# Patient Record
Sex: Male | Born: 1965 | Race: White | Hispanic: No | Marital: Married | Smoking: Never smoker
Health system: Southern US, Community
[De-identification: ages and names within clinical notes are randomized; demographics above are authoritative.]

## PROBLEM LIST (undated history)

## (undated) DIAGNOSIS — R51 Headache: Secondary | ICD-10-CM

## (undated) DIAGNOSIS — R519 Headache, unspecified: Secondary | ICD-10-CM

## (undated) DIAGNOSIS — Z86711 Personal history of pulmonary embolism: Secondary | ICD-10-CM

## (undated) HISTORY — PX: TONSILLECTOMY: SUR1361

## (undated) HISTORY — DX: Personal history of pulmonary embolism: Z86.711

---

## 2007-08-24 ENCOUNTER — Emergency Department (HOSPITAL_COMMUNITY): Admission: EM | Admit: 2007-08-24 | Discharge: 2007-08-24 | Payer: Self-pay | Admitting: Emergency Medicine

## 2015-09-15 NOTE — Pre-Procedure Instructions (Signed)
ERCEL PEPITONE  09/15/2015      Your procedure is scheduled on September 18.  Report to [facility] Admitting at 7:30 A.M.  Call this number if you have problems the morning of surgery:  [phone]   Remember:  Do not eat food or drink liquids after midnight.  Take these medicines the morning of surgery with A SIP OF WATER None   STOP Ibuprofen today   STOP/ Do not take Aspirin, Aleve, Naproxen, Advil, Ibuprofen, Motrin, Vitamins, Herbs, or Supplements starting today   Do not wear jewelry, make-up or nail polish.  Do not wear lotions, powders, or perfumes, or deoderant.  Do not shave 48 hours prior to surgery.  Men may shave face and neck.  Do not bring valuables to the hospital.  [facility] is not responsible for any belongings or valuables.  Contacts, dentures or bridgework may not be worn into surgery.  Leave your suitcase in the car.  After surgery it may be brought to your room.  For patients admitted to the hospital, discharge time will be determined by your treatment team.  Patients discharged the day of surgery will not be allowed to drive home.   [facility] - Preparing for Surgery  Before surgery, you can play an important role.  Because skin is not sterile, your skin needs to be as free of germs as possible.  You can reduce the number of germs on you skin by washing with CHG (chlorahexidine gluconate) soap before surgery.  CHG is an antiseptic cleaner which kills germs and bonds with the skin to continue killing germs even after washing.  Please DO NOT use if you have an allergy to CHG or antibacterial soaps.  If your skin becomes reddened/irritated stop using the CHG and inform your nurse when you arrive at Short Stay.  Do not shave (including legs and underarms) for at least 48 hours prior to the first CHG shower.  You may shave your face.  Please follow these instructions carefully:   1.  Shower with CHG Soap the night before surgery and the  morning of Surgery.  2.  If you choose to wash your hair, wash your hair first as usual with your normal shampoo.  3.  After you shampoo, rinse your hair and body thoroughly to remove the shampoo.  4.  Use CHG as you would any other liquid soap.  You can apply CHG directly to the skin and wash gently with scrungie or a clean washcloth.  5.  Apply the CHG Soap to your body ONLY FROM THE NECK DOWN.  Do not use on open wounds or open sores.  Avoid contact with your eyes, ears, mouth and genitals (private parts).  Wash genitals (private parts) with your normal soap.  6.  Wash thoroughly, paying special attention to the area where your surgery will be performed.  7.  Thoroughly rinse your body with warm water from the neck down.  8.  DO NOT shower/wash with your normal soap after using and rinsing off the CHG Soap.  9.  Pat yourself dry with a clean towel.            10.  Wear clean pajamas.            11.  Place clean sheets on your bed the night of your first shower and do not sleep with pets.  Day of Surgery  Do not apply any lotions the morning of surgery.  Please  wear clean clothes to the hospital/surgery center.

## 2015-09-16 ENCOUNTER — Other Ambulatory Visit (HOSPITAL_COMMUNITY): Payer: Self-pay | Admitting: *Deleted

## 2015-09-16 ENCOUNTER — Encounter (HOSPITAL_COMMUNITY): Payer: Self-pay

## 2015-09-16 ENCOUNTER — Encounter (HOSPITAL_COMMUNITY)
Admission: RE | Admit: 2015-09-16 | Discharge: 2015-09-16 | Disposition: A | Discharge status: Home / Self Care | Payer: 59 | Source: Ambulatory Visit | Attending: General Surgery | Admitting: General Surgery

## 2015-09-16 DIAGNOSIS — Z01812 Encounter for preprocedural laboratory examination: Secondary | ICD-10-CM | POA: Insufficient documentation

## 2015-09-16 HISTORY — DX: Headache: R51

## 2015-09-16 HISTORY — DX: Headache, unspecified: R51.9

## 2015-09-16 LAB — CBC
HEMATOCRIT: 47.3 % (ref 39.0–52.0)
HEMOGLOBIN: 15.8 g/dL (ref 13.0–17.0)
MCH: 30.7 pg (ref 26.0–34.0)
MCHC: 33.4 g/dL (ref 30.0–36.0)
MCV: 91.8 fL (ref 78.0–100.0)
Platelets: 320 10*3/uL (ref 150–400)
RBC: 5.15 MIL/uL (ref 4.22–5.81)
RDW: 12.4 % (ref 11.5–15.5)
WBC: 6.8 10*3/uL (ref 4.0–10.5)

## 2015-09-16 NOTE — Progress Notes (Signed)
No Orders @ pre-admission,office has been called twice.

## 2015-09-22 ENCOUNTER — Ambulatory Visit (HOSPITAL_BASED_OUTPATIENT_CLINIC_OR_DEPARTMENT_OTHER)
Admission: RE | Admit: 2015-09-22 | Discharge: 2015-09-23 | Disposition: A | Discharge status: Home / Self Care | Payer: 59 | Source: Ambulatory Visit | Attending: General Surgery | Admitting: General Surgery

## 2015-09-22 ENCOUNTER — Encounter (HOSPITAL_COMMUNITY)
Admission: RE | Disposition: A | Discharge status: Home / Self Care | Payer: Self-pay | Source: Ambulatory Visit | Attending: General Surgery

## 2015-09-22 ENCOUNTER — Ambulatory Visit (HOSPITAL_COMMUNITY): Payer: 59 | Admitting: Anesthesiology

## 2015-09-22 ENCOUNTER — Encounter (HOSPITAL_COMMUNITY): Payer: Self-pay | Admitting: Anesthesiology

## 2015-09-22 DIAGNOSIS — K4020 Bilateral inguinal hernia, without obstruction or gangrene, not specified as recurrent: Secondary | ICD-10-CM | POA: Diagnosis present

## 2015-09-22 DIAGNOSIS — I248 Other forms of acute ischemic heart disease: Secondary | ICD-10-CM | POA: Diagnosis present

## 2015-09-22 DIAGNOSIS — R55 Syncope and collapse: Secondary | ICD-10-CM | POA: Diagnosis present

## 2015-09-22 DIAGNOSIS — I2602 Saddle embolus of pulmonary artery with acute cor pulmonale: Secondary | ICD-10-CM | POA: Diagnosis not present

## 2015-09-22 DIAGNOSIS — I82431 Acute embolism and thrombosis of right popliteal vein: Secondary | ICD-10-CM | POA: Diagnosis present

## 2015-09-22 DIAGNOSIS — I447 Left bundle-branch block, unspecified: Secondary | ICD-10-CM | POA: Diagnosis present

## 2015-09-22 DIAGNOSIS — I959 Hypotension, unspecified: Secondary | ICD-10-CM | POA: Insufficient documentation

## 2015-09-22 DIAGNOSIS — I2510 Atherosclerotic heart disease of native coronary artery without angina pectoris: Secondary | ICD-10-CM | POA: Diagnosis present

## 2015-09-22 DIAGNOSIS — I951 Orthostatic hypotension: Secondary | ICD-10-CM | POA: Diagnosis present

## 2015-09-22 DIAGNOSIS — I272 Other secondary pulmonary hypertension: Secondary | ICD-10-CM | POA: Diagnosis present

## 2015-09-22 DIAGNOSIS — R0902 Hypoxemia: Secondary | ICD-10-CM | POA: Diagnosis not present

## 2015-09-22 DIAGNOSIS — I420 Dilated cardiomyopathy: Secondary | ICD-10-CM | POA: Diagnosis present

## 2015-09-22 DIAGNOSIS — I5041 Acute combined systolic (congestive) and diastolic (congestive) heart failure: Secondary | ICD-10-CM | POA: Diagnosis present

## 2015-09-22 HISTORY — PX: INGUINAL HERNIA REPAIR: SHX194

## 2015-09-22 HISTORY — PX: INSERTION OF MESH: SHX5868

## 2015-09-22 LAB — CBC
HCT: 42.7 % (ref 39.0–52.0)
Hemoglobin: 14.3 g/dL (ref 13.0–17.0)
MCH: 31.1 pg (ref 26.0–34.0)
MCHC: 33.5 g/dL (ref 30.0–36.0)
MCV: 92.8 fL (ref 78.0–100.0)
PLATELETS: 274 10*3/uL (ref 150–400)
RBC: 4.6 MIL/uL (ref 4.22–5.81)
RDW: 12.4 % (ref 11.5–15.5)
WBC: 17.2 10*3/uL — ABNORMAL HIGH (ref 4.0–10.5)

## 2015-09-22 SURGERY — REPAIR, HERNIA, INGUINAL, BILATERAL, LAPAROSCOPIC
Anesthesia: General | Laterality: Bilateral

## 2015-09-22 MED ORDER — PROPOFOL 10 MG/ML IV BOLUS
INTRAVENOUS | Status: DC | PRN
  Administered 2015-09-22: 50 mg via INTRAVENOUS

## 2015-09-22 MED ORDER — LIDOCAINE HCL (CARDIAC) 20 MG/ML IV SOLN
INTRAVENOUS | Status: DC | PRN
  Administered 2015-09-22: 60 mg via INTRAVENOUS

## 2015-09-22 MED ORDER — ONDANSETRON HCL 4 MG/2ML IJ SOLN
4.0000 mg | INTRAMUSCULAR | Status: DC | PRN
Start: 2015-09-22 — End: 2015-09-23
  Administered 2015-09-22 – 2015-09-23 (×2): 4 mg via INTRAVENOUS
  Filled 2015-09-22: qty 2

## 2015-09-22 MED ORDER — ONDANSETRON HCL 4 MG/2ML IJ SOLN
INTRAMUSCULAR | Status: AC
  Filled 2015-09-22: qty 2

## 2015-09-22 MED ORDER — ROCURONIUM BROMIDE 100 MG/10ML IV SOLN
INTRAVENOUS | Status: DC | PRN
  Administered 2015-09-22 (×2): 20 mg via INTRAVENOUS
  Administered 2015-09-22: 40 mg via INTRAVENOUS

## 2015-09-22 MED ORDER — OXYCODONE HCL 5 MG PO TABS
ORAL_TABLET | ORAL | Status: AC
Start: 2015-09-22 — End: 2015-09-23
  Filled 2015-09-22: qty 1

## 2015-09-22 MED ORDER — FENTANYL CITRATE (PF) 100 MCG/2ML IJ SOLN
INTRAMUSCULAR | Status: DC | PRN
  Administered 2015-09-22 (×5): 100 ug via INTRAVENOUS

## 2015-09-22 MED ORDER — CEFAZOLIN SODIUM-DEXTROSE 2-4 GM/100ML-% IV SOLN
INTRAVENOUS | Status: AC
  Filled 2015-09-22: qty 100

## 2015-09-22 MED ORDER — FENTANYL CITRATE (PF) 100 MCG/2ML IJ SOLN
INTRAMUSCULAR | Status: AC
  Filled 2015-09-22: qty 2

## 2015-09-22 MED ORDER — FENTANYL CITRATE (PF) 100 MCG/2ML IJ SOLN
INTRAMUSCULAR | Status: AC
  Filled 2015-09-22: qty 4

## 2015-09-22 MED ORDER — ONDANSETRON 4 MG PO TBDP: 4.0000 mg | ORAL_TABLET | Freq: Four times a day (QID) | ORAL | Status: DC | PRN

## 2015-09-22 MED ORDER — MIDAZOLAM HCL 5 MG/5ML IJ SOLN
INTRAMUSCULAR | Status: DC | PRN
  Administered 2015-09-22: 2 mg via INTRAVENOUS

## 2015-09-22 MED ORDER — FENTANYL CITRATE (PF) 100 MCG/2ML IJ SOLN
25.0000 ug | INTRAMUSCULAR | Status: DC | PRN
  Administered 2015-09-22 (×2): 50 ug via INTRAVENOUS

## 2015-09-22 MED ORDER — OXYCODONE HCL 5 MG PO TABS: 5.0000 mg | ORAL_TABLET | ORAL | 0 refills | Status: DC | PRN

## 2015-09-22 MED ORDER — 0.9 % SODIUM CHLORIDE (POUR BTL) OPTIME
TOPICAL | Status: DC | PRN
  Administered 2015-09-22: 1000 mL

## 2015-09-22 MED ORDER — KCL IN DEXTROSE-NACL 20-5-0.9 MEQ/L-%-% IV SOLN
INTRAVENOUS | Status: DC
  Administered 2015-09-22: 23:00:00 via INTRAVENOUS
  Filled 2015-09-22: qty 1000

## 2015-09-22 MED ORDER — CEFAZOLIN SODIUM-DEXTROSE 2-3 GM-% IV SOLR
INTRAVENOUS | Status: DC | PRN
  Administered 2015-09-22: 2 g via INTRAVENOUS

## 2015-09-22 MED ORDER — OXYCODONE HCL 5 MG PO TABS
5.0000 mg | ORAL_TABLET | ORAL | Status: DC | PRN
  Administered 2015-09-22 (×3): 5 mg via ORAL
  Filled 2015-09-22: qty 2

## 2015-09-22 MED ORDER — PROPOFOL 10 MG/ML IV BOLUS
INTRAVENOUS | Status: AC
  Filled 2015-09-22: qty 20

## 2015-09-22 MED ORDER — DOCUSATE SODIUM 100 MG PO CAPS
100.0000 mg | ORAL_CAPSULE | Freq: Two times a day (BID) | ORAL | Status: DC
  Administered 2015-09-22 – 2015-09-23 (×2): 100 mg via ORAL
  Filled 2015-09-22 (×2): qty 1

## 2015-09-22 MED ORDER — SUGAMMADEX SODIUM 200 MG/2ML IV SOLN
INTRAVENOUS | Status: DC | PRN
  Administered 2015-09-22: 200 mg via INTRAVENOUS

## 2015-09-22 MED ORDER — MIDAZOLAM HCL 2 MG/2ML IJ SOLN
INTRAMUSCULAR | Status: AC
  Filled 2015-09-22: qty 2

## 2015-09-22 MED ORDER — MORPHINE SULFATE (PF) 2 MG/ML IV SOLN: 2.0000 mg | INTRAVENOUS | Status: DC | PRN

## 2015-09-22 MED ORDER — LIDOCAINE 2% (20 MG/ML) 5 ML SYRINGE
INTRAMUSCULAR | Status: AC
  Filled 2015-09-22: qty 5

## 2015-09-22 MED ORDER — ROCURONIUM BROMIDE 10 MG/ML (PF) SYRINGE
PREFILLED_SYRINGE | INTRAVENOUS | Status: AC
  Filled 2015-09-22: qty 10

## 2015-09-22 MED ORDER — PROMETHAZINE HCL 25 MG/ML IJ SOLN: 6.2500 mg | INTRAMUSCULAR | Status: DC | PRN

## 2015-09-22 MED ORDER — BUPIVACAINE-EPINEPHRINE 0.5% -1:200000 IJ SOLN
INTRAMUSCULAR | Status: DC | PRN
  Administered 2015-09-22: 8 mL

## 2015-09-22 MED ORDER — IBUPROFEN 400 MG PO TABS: 400.0000 mg | ORAL_TABLET | Freq: Four times a day (QID) | ORAL | Status: DC | PRN

## 2015-09-22 MED ORDER — OXYCODONE HCL 5 MG PO TABS
ORAL_TABLET | ORAL | Status: AC
  Filled 2015-09-22: qty 1

## 2015-09-22 MED ORDER — OXYCODONE HCL 5 MG PO TABS
5.0000 mg | ORAL_TABLET | ORAL | Status: DC | PRN
  Administered 2015-09-23 (×2): 5 mg via ORAL
  Filled 2015-09-22 (×3): qty 1

## 2015-09-22 MED ORDER — LACTATED RINGERS IV SOLN
INTRAVENOUS | Status: DC
  Administered 2015-09-22 (×3): via INTRAVENOUS

## 2015-09-22 MED ORDER — ONDANSETRON HCL 4 MG/2ML IJ SOLN
INTRAMUSCULAR | Status: DC | PRN
  Administered 2015-09-22: 4 mg via INTRAVENOUS

## 2015-09-22 MED ORDER — SUGAMMADEX SODIUM 200 MG/2ML IV SOLN
INTRAVENOUS | Status: AC
  Filled 2015-09-22: qty 4

## 2015-09-22 MED ORDER — BUPIVACAINE-EPINEPHRINE (PF) 0.5% -1:200000 IJ SOLN
INTRAMUSCULAR | Status: AC
  Filled 2015-09-22: qty 30

## 2015-09-22 SURGICAL SUPPLY — 41 items
APPLIER CLIP LOGIC TI 5 (MISCELLANEOUS) IMPLANT
BENZOIN TINCTURE PRP APPL 2/3 (GAUZE/BANDAGES/DRESSINGS) ×3 IMPLANT
CANISTER SUCTION 2500CC (MISCELLANEOUS) ×3 IMPLANT
CHLORAPREP W/TINT 26ML (MISCELLANEOUS) ×3 IMPLANT
CLOSURE WOUND 1/2 X4 (GAUZE/BANDAGES/DRESSINGS) ×1
COVER SURGICAL LIGHT HANDLE (MISCELLANEOUS) ×3 IMPLANT
DECANTER SPIKE VIAL GLASS SM (MISCELLANEOUS) ×3 IMPLANT
DISSECT BALLN SPACEMKR + OVL (BALLOONS) ×3
DISSECTOR BALLN SPACEMKR + OVL (BALLOONS) ×1 IMPLANT
DISSECTOR BLUNT TIP ENDO 5MM (MISCELLANEOUS) ×3 IMPLANT
DRAPE LAPAROSCOPIC ABDOMINAL (DRAPES) ×3 IMPLANT
DRSG TEGADERM 2-3/8X2-3/4 SM (GAUZE/BANDAGES/DRESSINGS) ×6 IMPLANT
DRSG TEGADERM 4X4.75 (GAUZE/BANDAGES/DRESSINGS) ×3 IMPLANT
ELECT REM PT RETURN 9FT ADLT (ELECTROSURGICAL) ×3
ELECTRODE REM PT RTRN 9FT ADLT (ELECTROSURGICAL) ×1 IMPLANT
GAUZE SPONGE 2X2 8PLY STRL LF (GAUZE/BANDAGES/DRESSINGS) ×1 IMPLANT
GLOVE BIOGEL PI IND STRL 7.0 (GLOVE) ×1 IMPLANT
GLOVE BIOGEL PI IND STRL 8 (GLOVE) ×1 IMPLANT
GLOVE BIOGEL PI INDICATOR 7.0 (GLOVE) ×2
GLOVE BIOGEL PI INDICATOR 8 (GLOVE) ×2
GLOVE ECLIPSE 8.0 STRL XLNG CF (GLOVE) ×3 IMPLANT
GLOVE SURG SS PI 6.5 STRL IVOR (GLOVE) ×3 IMPLANT
GOWN STRL REUS W/ TWL LRG LVL3 (GOWN DISPOSABLE) ×4 IMPLANT
GOWN STRL REUS W/TWL LRG LVL3 (GOWN DISPOSABLE) ×8
KIT BASIN OR (CUSTOM PROCEDURE TRAY) ×3 IMPLANT
KIT ROOM TURNOVER OR (KITS) ×3 IMPLANT
MESH HERNIA 6X6 BARD (Mesh General) ×2 IMPLANT
MESH HERNIA BARD 6X6 (Mesh General) ×4 IMPLANT
NEEDLE INSUFFLATION 14GA 120MM (NEEDLE) ×3 IMPLANT
NS IRRIG 1000ML POUR BTL (IV SOLUTION) ×3 IMPLANT
PAD ARMBOARD 7.5X6 YLW CONV (MISCELLANEOUS) ×6 IMPLANT
SET IRRIG TUBING LAPAROSCOPIC (IRRIGATION / IRRIGATOR) IMPLANT
SET TROCAR LAP APPLE-HUNT 5MM (ENDOMECHANICALS) ×3 IMPLANT
SPONGE GAUZE 2X2 STER 10/PKG (GAUZE/BANDAGES/DRESSINGS) ×2
STRIP CLOSURE SKIN 1/2X4 (GAUZE/BANDAGES/DRESSINGS) ×2 IMPLANT
SUT MNCRL AB 4-0 PS2 18 (SUTURE) ×6 IMPLANT
TACKER 5MM HERNIA 3.5CML NAB (ENDOMECHANICALS) ×3 IMPLANT
TOWEL OR 17X24 6PK STRL BLUE (TOWEL DISPOSABLE) ×3 IMPLANT
TOWEL OR 17X26 10 PK STRL BLUE (TOWEL DISPOSABLE) ×3 IMPLANT
TRAY LAPAROSCOPIC MC (CUSTOM PROCEDURE TRAY) ×3 IMPLANT
TUBING INSUFFLATION (TUBING) ×3 IMPLANT

## 2015-09-22 NOTE — Consult Note (Signed)
CARDIOLOGY CONSULT NOTE   Patient ID: Wayne Bolton MRN: [id number] DOB/AGE: May 22, 1965 50 y.o.  Admit date: 09/22/2015  Primary Physician   [name] Primary Cardiologist   New, Dr [name] Reason for Consultation   Presyncope, LBBB Requesting MD: Dr [name]  Wayne Bolton:WJXB Wayne Bolton is Wayne 50 y.o. year old male with Wayne history of migraine HA associated w/ N&V. Only ECG was > 10 yr ago for insurance physical.  He has been having problems with bilateral inguinal hernias>>came in today for outpatient repair.  Before and during the surgery, he had 1800 cc IVF. After the surgery, he finally had to urinate, it took some time, but he did not feel like he was straining. He was Wayne little light-headed in the bathroom, then gradually the light-headed feeling got worse (no heart rate available). He became very nauseated. He was put in Wayne chair, and was extremely pale. After that, he was put on the monitor and SBP was 100, sx improved. More hydration given.  He sat up trying to use the urinal, but became nauseated and was light-headed. He was on telemetry, HR 60s-70s. SBP 98 sitting up, 130s lying down. He has had some nausea intermittently since then, has not tried to stand up since then.  He had anesthesia at age 9, not since. He does not tolerate red meat or some cheeses due to HA, which can lead to N&V. He tried to donate blood in the remote past, but would only get about halfway through the donation and would get pale, diaphoretic and presyncopal>>have to stop.   No hx syncope. He is active, enjoys hiking and being active without symptoms. He was an enthusiastic bicyclist until he developed knee pain.   Wayne paternal uncle died suddenly in Wayne chair at 42, his grandfather died in Wayne similar fashion in his 27s (no autopsy). Another uncle had heart attacks in his 65s and 53s, no further details available, died in his 35s, not SCD.  Past Medical History:  Diagnosis Date  . Headache     history migraines none in last few years     Past Surgical History:  Procedure Laterality Date  . TONSILLECTOMY      Allergies  Allergen Reactions  . No Known Allergies     I have reviewed the patient's current medications . ceFAZolin      . fentaNYL      . ondansetron      . oxyCODONE      . oxyCODONE       . lactated ringers 10 mL/hr at 09/22/15 0823   fentaNYL (SUBLIMAZE) injection, ondansetron **OR** ondansetron (ZOFRAN) IV, oxyCODONE, promethazine  Prior to Admission medications   Medication Sig Start Date End Date Taking? Authorizing Provider  ibuprofen (ADVIL,MOTRIN) 200 MG tablet Take 400 mg by mouth every 6 (six) hours as needed for mild pain.   Yes Historical Provider, MD  oxyCODONE (OXY IR/ROXICODONE) 5 MG immediate release tablet Take 1-2 tablets (5-10 mg total) by mouth every 4 (four) hours as needed for moderate pain, severe pain or breakthrough pain. 09/22/15   Avel Peace, MD     Social History   Social History  . Marital status: Married    Spouse name: N/Wayne  . Number of children: N/Wayne  . Years of education: N/Wayne   Occupational History  . Acupuncturist    Social History Main Topics  . Smoking status: Never Smoker  . Smokeless tobacco: Never Used  .  Alcohol use Yes     Comment: occasionally  . Drug use: No  . Sexual activity: Not on file   Other Topics Concern  . Not on file   Social History Narrative   [place] in [place], 2 children in college.    Family Status  Relation Status  . Mother Alive  . Father Alive  . Paternal Grandfather Deceased   Family History  Problem Relation Age of Onset  . Other Paternal Grandfather [phone]    felt fine, came home from dinner, found dead, no autopsy     ROS:  Full 14 point review of systems complete and found to be negative unless listed above.  Physical Exam: Blood pressure 136/86, pulse 87, temperature 97.4 F (36.3 C), resp. rate 17, height 6\' 1"  (1.854 m), weight 208 lb (94.3  kg), SpO2 97 %.  General: Well developed, well nourished, male in no acute distress Head: Eyes PERRLA, No xanthomas.   Normocephalic and atraumatic, oropharynx without edema or exudate. Dentition: good Lungs: CTA bilaterally Heart: HRRR S1 S2, no rub/gallop, soft apical murmur. pulses are 2+ all 4 extrem.   Neck: No carotid bruits. No lymphadenopathy.  JVD not elevated Abdomen: Bowel sounds present, abdomen soft and tender without masses or hernias noted. Incisions in bilateral groins, dressed and not disturbed. Msk:  No spine or cva tenderness. No weakness, no joint deformities or effusions. Extremities: No clubbing or cyanosis. No edema. Back is diaphoretic Neuro: Alert and oriented X 3. No focal deficits noted. Psych:  Good affect, responds appropriately Skin: No rashes or lesions noted.  Labs:   Lab Results  Component Value Date   WBC 17.2 (H) 09/22/2015   HGB 14.3 09/22/2015   HCT 42.7 09/22/2015   MCV 92.8 09/22/2015   PLT 274 09/22/2015   No results for input(s): NA, K, CL, CO2, BUN, CREATININE, CALCIUM, PROT, BILITOT, ALKPHOS, ALT, AST, GLUCOSE in the last 168 hours.  Invalid input(s): LABALBU  Echo: n/Wayne  ECG:  09/18 SR, LBBB (no old)  Cath: n/Wayne  ASSESSMENT AND PLAN:   The patient was seen today by Dr Anne FuSkains, the patient evaluated and the data reviewed.     Near-syncope - probable orthostasis, rx w/ IVF and po intake - CBG may have been low, not checked and he has had soda since then - suggest ck orthostatic VS and CBG, further eval based on results. - hx vagal response to blood draws, donation and physical stress with inadequate po intake.     LBBB - no ECG in > 10 yr - no hx echo or ischemic eval. -  Two 2nd degree relatives with sudden death, ?cardiac - MD advise on further eval and if needs to be done as inpt.  Active Problems:   Bilateral inguinal hernia - per Dr Abbey Chattersosenbower, pt is for d/c later if sx improved.   Near syncope   LBBB (left bundle branch  block)   Signed: [name], Rhonda, PA-C 09/22/2015 5:24 PM Beeper [phone]  Co-Sign MD  Personally seen and examined. Agree with above.  50 year old male with newly discovered left bundle branch block, history of near syncope, vasovagal syncope with postoperative near-syncope, diaphoresis, nausea compatible with vasovagal reaction.   - Agree with hydration. He is on his third bag of fluids. Continue.  - His symptoms are compatible with vasovagal reaction. (Diaphoresis, nausea, decrease in blood pressure, near syncope). He has been prone to this most of his life. He has felt this way when giving  blood for instance. His blood pressure is usually low normal. This may have also been precipitated by recent anesthesia.  - Recommendations are to liberalize salt intake and fluid intake. No JVD, regular rate and rhythm, soft apical murmur, lungs are clear, abdomen benign  Left bundle branch block  - Sometimes this is associated with underlying coronary artery disease and because of this newly discovered left bundle branch block, we will proceed with Lexiscan nuclear stress test as an outpatient in approximately 4-6 weeks as he recovers from his hernia repair.  - We will also check an echocardiogram to ensure proper structure and function of his heart. He does have Wayne soft apical murmur.  He will be observed overnight.  Donato Schultz, MD

## 2015-09-22 NOTE — Addendum Note (Signed)
Addendum  created 09/22/15 1451 by Shelton SilvasKevin D Deloyd Handy, MD   Sign clinical note

## 2015-09-22 NOTE — Interval H&P Note (Signed)
History and Physical Interval Note:  09/22/2015 9:28 AM  Wayne Bolton  has presented today for surgery, with the diagnosis of Bilateral inguinal hernias  The various methods of treatment have been discussed with the patient and family. After consideration of risks, benefits and other options for treatment, the patient has consented to  Procedure(s): LAPAROSCOPIC BILATERAL INGUINAL HERNIA REPAIR WITH MESH (Bilateral) INSERTION OF MESH (Bilateral) as a surgical intervention .  The patient's history has been reviewed, patient examined, no change in status, stable for surgery.  I have reviewed the patient's chart and labs.  Questions were answered to the patient's satisfaction.     Jarmarcus Wambold JShela Commons

## 2015-09-22 NOTE — Anesthesia Postprocedure Evaluation (Addendum)
Anesthesia Post Note  Patient: Wayne Bolton  Procedure(s) Performed: Procedure(s) (LRB): LAPAROSCOPIC BILATERAL INGUINAL HERNIA REPAIR WITH MESH (Bilateral) INSERTION OF MESH (Bilateral)  Patient location during evaluation: PACU Anesthesia Type: General Level of consciousness: awake and alert Pain management: pain level controlled Vital Signs Assessment: post-procedure vital signs reviewed and stable Respiratory status: spontaneous breathing, nonlabored ventilation, respiratory function stable and patient connected to nasal cannula oxygen Cardiovascular status: blood pressure returned to baseline and stable Postop Assessment: no signs of nausea or vomiting Anesthetic complications: no Comments: LBBB noted in OR, confirmed with 12 lead in PACU. [name] denies active cardiac symptoms. Discussed cardiology consult for further work up prior to discharge with Dr. Abbey Chattersosenbower. Decision made to have patient follow up with PCP. Family informed by Dr. Abbey Chattersosenbower.    Prior to discharge, while attempting to ambulate, pt had syncopal episode. He received an additional 1L bolus of IVF. He now appears pallor and diaphoretic. He is complaining of dizziness but denies chest pain. STAT CBC ordered, surgeon notified. I informed patient he made need to be admitted and observed overnight. I also reiterated my concern for a cardiac workup in the near future.       Last Vitals:  Vitals:   09/22/15 1245 09/22/15 1255  BP: 112/68 121/78  Pulse: 62 64  Resp: 13 11  Temp:  36.3 C    Last Pain:  Vitals:   09/22/15 1230  TempSrc:   PainSc: 4                  Shelton SilvasKevin D Chevez Sambrano

## 2015-09-22 NOTE — Op Note (Signed)
OPERATIVE NOTE-LAPAROSCOPIC BILATERAL  INGUINAL HERNIA REPAIR WITH MESH  PREOP DX:  Bilateral inguinal hernias  POSTOP DX:  Same (Left-pantaloon, Right-direct)  PROCEDURE:  Laparoscopic bilateral inguinal hernia repair with mesh  SURGEON:  Avel Peaceodd Estie Sproule, M.D.  ANESTHESIA:  General  INDICATION:  This is a 50 year old male with a symptomatic left inguinal hernia and an asymptomatic right inguinal hernia who presents for the above procedure.   TECHNIQUE:  He was seen in the holding area. He voided. He was brought to the operating room, placed supine on the operating table, and a general anesthetic was given.  The hair on the abdominal wall and groin areas was clipped. These areas were then sterilely prepped and draped. A timeout was performed.  Marcaine was infiltrated in the subumbilical region. A transverse subumbilical incision was made through the skin and subcutaneous tissue. A small incision was made in the  left anterior rectus sheath. The rectus muscle was swept laterally exposing the posterior rectus sheath. A balloon dissection device was then placed into the extraperitoneal space.  Under laparoscopic vision, balloon dissection was performed of the subumbilical extra peritoneal space. The balloon was then removed and CO2 gas insufflated.  Two 5 mm trocars were placed in the lower midline.  Using blunt dissection, the symphysis pubis was identified. Cooper's ligament was identified bilaterally.    Beginning on the left side, extraperitoneal tissues were dissected free from the anterior and lateral abdominal wall. The spermatic cord was isolated and a window created around it.  A pantaloon hernia was noted.  The hernia sacs and contents were then reduced. Peritoneum was stripped back on the spermatic cord.  The right side was then approached and extraperitoneal tissues were dissected free from the anterior and lateral abdominal wall. The spermatic cord was isolated and a window created  around it. A direct hernia was noted.  The hernia sac and contents were then reduced. Peritoneum was stripped back on the spermatic cord.  A pneumoperitoneum was noticed with no obvious peritoneal defect seen.  This was evacuated via a Veress needle.   A piece of 5" x 6" mesh with a partial longitudinal slit in it was then placed into the left extraperitoneal space.  It was then anchored to Cooper's ligament, the anterior abdominal wall, and the lateral abdominal wall with spiral tacks. This provided adequate coverage with good overlap of the direct, indirect, and femoral spaces.  Next, the right side was approached.  A piece of 5" x 6" mesh with a partial longitudinal slit in it was then placed into this extraperitoneal space.  It was then anchored to Cooper's ligament, the anterior abdominal wall, and the lateral abdominal wall with spiral tacks. This provided adequate coverage with good overlap of the direct, indirect, and femoral spaces.  The extraperitoneal space was then inspected. There is no evidence of organ injury. Hemostasis was adequate. The inferolateral aspect of each piece of mesh was then held down and the CO2 gas release. The extraperitoneal contents were observed approximating the mesh. All trocars and the Veress needle were then removed.  The left anterior rectus sheath defect was closed with interrupted 0 Vicryl sutures.  Skin incisions were closed with 4-0 Monocryl subcuticular stitches. Steri-Strips and sterile dressings were applied.  He tolerated the procedure well without any apparent complications. He was taken to the recovery room in satisfactory condition. Estimated blood loss was approximately 50cc.

## 2015-09-22 NOTE — H&P (Signed)
Wayne Bolton 08/27/2015 11:33 AM Location: [facility] Patient #: [id number] DOB: [birth year] Married / Language: English / Race: White Male   History of Present Illness Wayne Pollack MD; 08/27/2015 12:11 PM) The patient is a 50 year old male.  Note:He is referred by Dr. Gerri Spore for consultation regarding a symptomatic left inguinal hernia. About 5 months ago he noticed intermittent stinging pain in the left groin and a bulge. He is somewhat restricted his activity because of this. No family history of hernia that he knows of. No difficulty with urination or constipation. No incarceration type symptoms. He states the bulge is becoming more noticeable.  Other Problems Gilmer Mor, CMA; 08/27/2015 11:33 AM) Back Pain Kidney Stone Migraine Headache  Past Surgical History Gilmer Mor, CMA; 08/27/2015 11:33 AM) Oral Surgery Tonsillectomy  Diagnostic Studies History Gilmer Mor, CMA; 08/27/2015 11:33 AM) Colonoscopy never  Allergies Lamar Laundry Bynum, CMA; 08/27/2015 11:34 AM) No Known Drug Allergies08/23/2017  Medication History Lamar Laundry Bynum, CMA; 08/27/2015 11:34 AM) No Current Medications Medications Reconciled  Social History Gilmer Mor, CMA; 08/27/2015 11:33 AM) Alcohol use Occasional alcohol use. Caffeine use Carbonated beverages. No drug use Tobacco use Never smoker.  Family History Gilmer Mor, CMA; 08/27/2015 11:33 AM) Hypertension Father.    Review of Systems Lamar Laundry Bynum CMA; 08/27/2015 11:33 AM) General Not Present- Appetite Loss, Chills, Fatigue, Fever, Night Sweats, Weight Gain and Weight Loss. Skin Not Present- Change in Wart/Mole, Dryness, Hives, Jaundice, New Lesions, Non-Healing Wounds, Rash and Ulcer. HEENT Not Present- Earache, Hearing Loss, Hoarseness, Nose Bleed, Oral Ulcers, Ringing in the Ears, Seasonal Allergies, Sinus Pain, Sore Throat, Visual Disturbances, Wears glasses/contact lenses and Yellow Eyes. Respiratory Not  Present- Bloody sputum, Chronic Cough, Difficulty Breathing, Snoring and Wheezing. Breast Not Present- Breast Mass, Breast Pain, Nipple Discharge and Skin Changes. Cardiovascular Not Present- Chest Pain, Difficulty Breathing Lying Down, Leg Cramps, Palpitations, Rapid Heart Rate, Shortness of Breath and Swelling of Extremities. Gastrointestinal Not Present- Abdominal Pain, Bloating, Bloody Stool, Change in Bowel Habits, Chronic diarrhea, Constipation, Difficulty Swallowing, Excessive gas, Gets full quickly at meals, Hemorrhoids, Indigestion, Nausea, Rectal Pain and Vomiting. Male Genitourinary Present- Change in Urinary Stream. Not Present- Blood in Urine, Frequency, Impotence, Nocturia, Painful Urination, Urgency and Urine Leakage. Musculoskeletal Not Present- Back Pain, Joint Pain, Joint Stiffness, Muscle Pain, Muscle Weakness and Swelling of Extremities. Neurological Not Present- Decreased Memory, Fainting, Headaches, Numbness, Seizures, Tingling, Tremor, Trouble walking and Weakness. Psychiatric Not Present- Anxiety, Bipolar, Change in Sleep Pattern, Depression, Fearful and Frequent crying. Endocrine Not Present- Cold Intolerance, Excessive Hunger, Hair Changes, Heat Intolerance, Hot flashes and New Diabetes. Hematology Not Present- Blood Thinners, Easy Bruising, Excessive bleeding, Gland problems, HIV and Persistent Infections.    Physical Exam Wayne Pollack MD; 08/27/2015 12:22 PM) The physical exam findings are as follows: Note:General: WDWN in NAD. Pleasant and cooperative.  HEENT: Radium/AT, no external nasal or ear masses, mucous membranes are moist  EYES: EOMI, no scleral icterus, pupils normal  CV: RRR, no murmur, no edema  CHEST: Breath sounds equal and clear. Respirations nonlabored.  ABDOMEN: Soft, nontender, reducible bulge with small (subcentimeter) fascial defect.  GU: Bilateral, reducible bulges left larger than right. No testicular masses  MUSCULOSKELETAL: FROM,  good muscle tone, no edema, no venous stasis changes, normal station and gait  SKIN: No jaundice.  NEUROLOGIC: Alert and oriented, answers questions appropriately, normal gait and station.  PSYCHIATRIC: Normal mood, affect , and behavior.    Assessment & Plan Wayne Pollack MD; 08/27/2015 12:14  PM) BILATERAL INGUINAL HERNIA WITH OBSTRUCTION AND WITHOUT GANGRENE, RECURRENCE NOT SPECIFIED (K40.00) Impression: Left side is moderate size and symptomatic. Right side is slightly smaller. I recommended laparoscopic repair with mesh and he is in agreement with this.  Plan: Laparoscopic bilateral inguinal hernia repair with mesh. I have explained the procedure, risks, and aftercare of inguinal hernia repair. Risks include but are not limited to bleeding, infection, wound problems, anesthesia, recurrence, bladder or intestine injury, urinary retention, testicular dysfunction, chronic pain, mesh problems. He seems to understand and agrees to proceed.  Avel Peaceodd Clearence Vitug, M.D.

## 2015-09-22 NOTE — Progress Notes (Signed)
After IV pt turned pale,stated he felt lightheaded.Fluid bolus started,O2 applied @ 2L/M via N/C. HR 62,R 16,B/P 128/74,Sats 99-100%. After 5 min pt states he felt much better and now has color in his face.

## 2015-09-22 NOTE — Anesthesia Preprocedure Evaluation (Addendum)
Anesthesia Evaluation  Patient identified by MRN, date of birth, ID band Patient awake    Reviewed: Allergy & Precautions, H&P , NPO status , Patient's Chart, lab work & pertinent test results  History of Anesthesia Complications Negative for: history of anesthetic complications  Airway Mallampati: II  TM Distance: >3 FB Neck ROM: full    Dental no notable dental hx.    Pulmonary neg pulmonary ROS,    Pulmonary exam normal breath sounds clear to auscultation       Cardiovascular negative cardio ROS Normal cardiovascular exam Rhythm:regular Rate:Normal     Neuro/Psych  Headaches, negative psych ROS   GI/Hepatic negative GI ROS, Neg liver ROS,   Endo/Other  negative endocrine ROS  Renal/GU negative Renal ROS  negative genitourinary   Musculoskeletal   Abdominal   Peds negative pediatric ROS (+)  Hematology negative hematology ROS (+)   Anesthesia Other Findings   Reproductive/Obstetrics negative OB ROS                            Anesthesia Physical Anesthesia Plan  ASA: I  Anesthesia Plan: General   Post-op Pain Management:    Induction: Intravenous  Airway Management Planned: Oral ETT  Additional Equipment:   Intra-op Plan:   Post-operative Plan: Extubation in OR  Informed Consent: I have reviewed the patients History and Physical, chart, labs and discussed the procedure including the risks, benefits and alternatives for the proposed anesthesia with the patient or authorized representative who has indicated his/her understanding and acceptance.   Dental Advisory Given  Plan Discussed with: Anesthesiologist, CRNA and Surgeon  Anesthesia Plan Comments:        Anesthesia Quick Evaluation

## 2015-09-22 NOTE — Progress Notes (Signed)
He is having pre-syncopal episodes when standing 2-3 minutes associated with hypotension but no tachycardia or significant increase in BP.  This is brought on by nausea he experiences when he stands.  Hemoglobin is 14.3 post op (15.8 preop) after at least 2 liters of IVF.  He has voided.  His abdomen is soft and not distended.  EKG shows a LBBB, he and his family are aware of this.  Will admit him for overnight observation.

## 2015-09-22 NOTE — Discharge Instructions (Addendum)
[facility] _______[facility]   INGUINAL HERNIA REPAIR: POST OP INSTRUCTIONS  Always review your discharge instruction sheet given to you by the facility where your surgery was performed. IF YOU HAVE DISABILITY OR FAMILY LEAVE FORMS, YOU MUST BRING THEM TO THE OFFICE FOR PROCESSING.   DO NOT GIVE THEM TO YOUR DOCTOR.  1. A  prescription for pain medication may be given to you upon discharge.  Take your pain medication as prescribed, if needed.  If narcotic pain medicine is not needed, then you may take acetaminophen (Tylenol) or ibuprofen (Advil) as needed. 2. Take your usually prescribed medications unless otherwise directed. 3. If you need a refill on your pain medication, please contact your pharmacy.  They will contact our office to request authorization. Prescriptions will not be filled after 5 pm or on week-ends. 4. You should follow a light diet the first 24 hours after arrival home, such as soup and crackers, etc.  Be sure to include lots of fluids daily.  Resume your normal diet the day after surgery. 5. Most patients will experience some swelling and bruising around the umbilicus or in the groin and scrotum.  Ice packs and reclining will help.  Swelling and bruising can take several days to resolve.  6. It is common to experience some constipation if taking pain medication after surgery.  Increasing fluid intake and taking a stool softener (such as Colace) will usually help or prevent this problem from occurring.  A mild laxative (Milk of Magnesia or Miralax) should be taken according to package directions if there are no bowel movements after 48 hours. 7. Unless discharge instructions indicate otherwise, you may remove your bandages 72 hours after surgery.  You may shower the day after surgery.  You may have steri-strips (small skin tapes) in place directly over the incision.  These strips should be left on the skin until they fall off.  If your surgeon used skin glue on the  incision, you may shower in 24 hours.  The glue will flake off over the next 2-3 weeks.  Any sutures or staples will be removed at the office during your follow-up visit. 8. ACTIVITIES:  You may resume light daily activities beginning the next day--such as daily self-care, walking, climbing stairs--gradually increasing activities as tolerated. Do not lie flat for the first 2-3 days. You may have sexual intercourse when it is comfortable.  Refrain from any heavy lifting or straining-nothing over 10 pounds for 6 weeks.   a. You may drive when you are no longer taking prescription pain medication, you can comfortably wear a seatbelt, and you can safely maneuver your car and apply brakes. b. RETURN TO WORK:  _Desk type work in one week, full duty in 6 weeks._________________________________________________________ 9. You should see your doctor in the office for a follow-up appointment approximately 2-3 weeks after your surgery.  Make sure that you call for this appointment within a day or two after you arrive home to insure a convenient appointment time. 10. OTHER INSTRUCTIONS:  ____Your EKG showed an abnormality.  Please make an appointment with your PCP to discuss this.______________________________________________________________________________________________________________________________________________________________________________________  WHEN TO CALL YOUR DOCTOR: 1. Fever over 101.0 2. Inability to urinate 3. Nausea and/or vomiting 4. Extreme swelling or bruising 5. Continued bleeding from incision. 6. Increased pain, redness, or drainage from the incision  The clinic staff is available to answer your questions during regular business hours.  Please dont hesitate to call and ask to speak to one of the  nurses for clinical concerns.  If you have a medical emergency, go to the nearest emergency room or call 911.  [facility] from [facility] is always on call at the  hospital   [address], [address], [place], [place]  [address] ?  [address] [address], [place], [place]   [id number] [phone] ? [phone] ? FAX [phone] Web site: [email or web address]

## 2015-09-22 NOTE — Anesthesia Procedure Notes (Signed)
Procedure Name: Intubation Date/Time: 09/22/2015 9:37 AM Performed by: Ferol LuzMCMILLEN, Yahia Bottger L Pre-anesthesia Checklist: Patient identified, Emergency Drugs available, Suction available and Patient being monitored Patient Re-evaluated:Patient Re-evaluated prior to inductionOxygen Delivery Method: Circle System Utilized Preoxygenation: Pre-oxygenation with 100% oxygen Intubation Type: IV induction Ventilation: Mask ventilation without difficulty Laryngoscope Size: Mac and 4 Grade View: Grade II Tube type: Oral Number of attempts: 1 Airway Equipment and Method: Stylet Placement Confirmation: ETT inserted through vocal cords under direct vision,  positive ETCO2 and breath sounds checked- equal and bilateral Secured at: 22 cm Tube secured with: Tape Dental Injury: Teeth and Oropharynx as per pre-operative assessment

## 2015-09-22 NOTE — Transfer of Care (Signed)
Immediate Anesthesia Transfer of Care Note  Patient: Wayne Bolton  Procedure(s) Performed: Procedure(s): LAPAROSCOPIC BILATERAL INGUINAL HERNIA REPAIR WITH MESH (Bilateral) INSERTION OF MESH (Bilateral)  Patient Location: PACU  Anesthesia Type:General  Level of Consciousness: sedated, patient cooperative and responds to stimulation  Airway & Oxygen Therapy: Patient Spontanous Breathing and Patient connected to nasal cannula oxygen  Post-op Assessment: Report given to RN, Post -op Vital signs reviewed and stable and Patient moving all extremities  Post vital signs: Reviewed and stable  Last Vitals:  Vitals:   09/22/15 0752  BP: 134/84  Pulse: 64  Resp: 20  Temp: 36.7 C    Last Pain:  Vitals:   09/22/15 0752  TempSrc: Oral      Patients Stated Pain Goal: 4 (09/22/15 [id number])  Complications: No apparent anesthesia complications

## 2015-09-23 ENCOUNTER — Encounter (HOSPITAL_COMMUNITY): Payer: Self-pay | Admitting: General Surgery

## 2015-09-23 DIAGNOSIS — I447 Left bundle-branch block, unspecified: Secondary | ICD-10-CM | POA: Diagnosis not present

## 2015-09-23 DIAGNOSIS — R55 Syncope and collapse: Secondary | ICD-10-CM | POA: Diagnosis not present

## 2015-09-23 MED ORDER — ACETAMINOPHEN 325 MG PO TABS: 650.0000 mg | ORAL_TABLET | ORAL | Status: DC | PRN

## 2015-09-23 MED ORDER — SODIUM CHLORIDE 0.9% FLUSH: 3.0000 mL | INTRAVENOUS | Status: DC | PRN

## 2015-09-23 MED ORDER — ACETAMINOPHEN 650 MG RE SUPP: 650.0000 mg | RECTAL | Status: DC | PRN

## 2015-09-23 MED ORDER — MORPHINE SULFATE (PF) 2 MG/ML IV SOLN: 2.0000 mg | INTRAVENOUS | Status: DC | PRN

## 2015-09-23 NOTE — Progress Notes (Signed)
Pt reported feeling better around 1100hrs, ate his breakfast and was helped into a chair. Came back to check on pt half an hour later and he reported feeling nauseous . Does not appear like pt is gearing up for discharge today

## 2015-09-23 NOTE — Progress Notes (Signed)
Went to talk to pt about his discharge home today and go over his paper work when pt reported that he felt slightly light headed and a little sweaty when he sat up to have his breakfast. Says that he does not feel like eating right now. Also mentioned that he was feeling fine while he was talking to Dr Abbey Chattersosenbower but does not feel so good right now.

## 2015-09-23 NOTE — Progress Notes (Signed)
   Patient Name: Glendora ScoreMark A Beel Date of Encounter: 09/23/2015  Primary [facility]  Hospital Problem List     Active Problems:   Bilateral inguinal hernia   Near syncope   LBBB (left bundle branch block)     Subjective   Feeling well. No further near syncope. No CP no SOB  Inpatient Medications    Scheduled Meds: . docusate sodium  100 mg Oral BID   Continuous Infusions: . dextrose 5 % and 0.9 % NaCl with KCl 20 mEq/L 50 mL/hr at 09/22/15 2304   PRN Meds:.acetaminophen **OR** acetaminophen, ibuprofen, morphine injection, ondansetron **OR** ondansetron (ZOFRAN) IV, oxyCODONE, sodium chloride flush   Vital Signs    Vitals:   09/22/15 1847 09/22/15 2128 09/23/15 0100 09/23/15 0548  BP: 117/64 (!) 110/54 122/66 122/72  Pulse: 79 75 72 68  Resp: 17 17 18 18   Temp: 99.6 F (37.6 C) 99.1 F (37.3 C) 99.1 F (37.3 C) 99.3 F (37.4 C)  TempSrc: Oral Oral Oral Oral  SpO2: 97% 98% 97% 98%  Weight:      Height:        Intake/Output Summary (Last 24 hours) at 09/23/15 0808 Last data filed at 09/23/15 0500  Gross per 24 hour  Intake             4190 ml  Output             1610 ml  Net             2580 ml   Filed Weights   09/22/15 0752  Weight: 208 lb (94.3 kg)    Physical Exam    GEN: Well nourished, well developed, in no acute distress.  HEENT: Grossly normal.  Neck: Supple, no JVD, carotid bruits, or masses. Cardiac: RRR, no murmurs, rubs, or gallops. No clubbing, cyanosis, edema.  Radials/DP/PT 2+ and equal bilaterally.  Respiratory:  Respirations regular and unlabored, clear to auscultation bilaterally. GI: Soft, nontender, nondistended, BS + x 4. MS: no deformity or atrophy. Skin: warm and dry, no rash. Neuro:  Strength and sensation are intact. Psych: AAOx3.  Normal affect.  Labs    CBC  Recent Labs  09/22/15 1432  WBC 17.2*  HGB 14.3  HCT 42.7  MCV 92.8  PLT 274     Telemetry    Yesterday in PACU - NSR LBBB  ECG    LBBB,  NSR - Personally Reviewed  Radiology    No results found.   Cardiac Studies   ECHO and stress test as outpatient pending  Patient Profile     50 year old with new discovered LBBB, near syncope post op hernia, post 4-5L of fluids, vagal like reaction.   Assessment & Plan    Vasovagal reaction  - liberalize salt and fluids at home  - lay down if near syncope sensation occurs again  - BP has always been low normal for him. He has propensity to have vagal like reactions (giving blood for instance)  LBBB  - occasionally can be associated with underlying CAD  - will check NUC stress test as outpatient in about 4 weeks (Lexiscan - pharmacologic test because of LBBB)  - will also check ECHO to ensure proper structure and function of heart.   OK with DC from cardiac perspective.    Signed, Donato SchultzMark Skains, MD  09/23/2015, 8:08 AM

## 2015-09-23 NOTE — Progress Notes (Signed)
Pt called nurse and stated that he felt well enough to go home. Discharge education given with no concerns expressed. Pt discharged home with wife in stable condition

## 2015-09-23 NOTE — Progress Notes (Signed)
1 Day Post-Op  Subjective: Feels much better this AM.  No syncopal episodes.  Eating. Voiding well.  Pain controlled.  Objective: Vital signs in last 24 hours: Temp:  [97.4 F (36.3 C)-99.6 F (37.6 C)] 99.3 F (37.4 C) (09/19 0548) Pulse Rate:  [55-90] 68 (09/19 0548) Resp:  [7-19] 18 (09/19 0548) BP: (98-158)/(54-100) 122/72 (09/19 0548) SpO2:  [95 %-100 %] 98 % (09/19 0548) Last BM Date: 09/22/15  Intake/Output from previous day: 09/18 0701 - 09/19 0700 In: 4190 [I.V.:3950] Out: 1610 [Urine:1600; Blood:10] Intake/Output this shift: No intake/output data recorded.  PE: General- In NAD Abdomen-soft, dressings dry GU-minimal swelling  Lab Results:   Recent Labs  09/22/15 1432  WBC 17.2*  HGB 14.3  HCT 42.7  PLT 274   BMET No results for input(s): NA, K, CL, CO2, GLUCOSE, BUN, CREATININE, CALCIUM in the last 72 hours. PT/INR No results for input(s): LABPROT, INR in the last 72 hours. Comprehensive Metabolic Panel: No results found for: NA, K, CL, CO2, BUN, CREATININE, GLUCOSE, CALCIUM, AST, ALT, ALKPHOS, BILITOT, PROT, ALBUMIN   Studies/Results: No results found.  Anti-infectives: Anti-infectives    Start     Dose/Rate Route Frequency Ordered Stop   09/22/15 0923  ceFAZolin (ANCEF) 2-4 GM/100ML-% IVPB    Comments:  Lonia ChimeraMoore, Kristi   : cabinet override      09/22/15 [id number] 09/22/15 2129      Assessment Active Problems:   Bilateral inguinal hernia-s/p laparoscopic repair with mesh 09/22/15   Near syncope secondary to vasovagal reaction-resolved   LBBB (left bundle branch block)-further work up as an outpatient    LOS: 0 days   Plan: Discharge today.   Aminat Shelburne J 09/23/2015

## 2015-09-24 ENCOUNTER — Encounter (HOSPITAL_COMMUNITY): Payer: Self-pay | Admitting: Emergency Medicine

## 2015-09-24 ENCOUNTER — Inpatient Hospital Stay (HOSPITAL_COMMUNITY)
Admission: EM | Admit: 2015-09-24 | Discharge: 2015-09-30 | DRG: 981 | Disposition: A | Discharge status: Home / Self Care | Payer: 59 | Attending: Internal Medicine | Admitting: Internal Medicine

## 2015-09-24 DIAGNOSIS — I2602 Saddle embolus of pulmonary artery with acute cor pulmonale: Secondary | ICD-10-CM

## 2015-09-24 DIAGNOSIS — R55 Syncope and collapse: Secondary | ICD-10-CM | POA: Diagnosis not present

## 2015-09-24 DIAGNOSIS — I447 Left bundle-branch block, unspecified: Secondary | ICD-10-CM | POA: Diagnosis present

## 2015-09-24 DIAGNOSIS — R778 Other specified abnormalities of plasma proteins: Secondary | ICD-10-CM | POA: Diagnosis present

## 2015-09-24 DIAGNOSIS — I429 Cardiomyopathy, unspecified: Secondary | ICD-10-CM | POA: Diagnosis present

## 2015-09-24 DIAGNOSIS — R7989 Other specified abnormal findings of blood chemistry: Secondary | ICD-10-CM

## 2015-09-24 DIAGNOSIS — I2699 Other pulmonary embolism without acute cor pulmonale: Secondary | ICD-10-CM

## 2015-09-24 DIAGNOSIS — I824Y3 Acute embolism and thrombosis of unspecified deep veins of proximal lower extremity, bilateral: Secondary | ICD-10-CM | POA: Diagnosis present

## 2015-09-24 DIAGNOSIS — I5041 Acute combined systolic (congestive) and diastolic (congestive) heart failure: Secondary | ICD-10-CM | POA: Diagnosis present

## 2015-09-24 DIAGNOSIS — R609 Edema, unspecified: Secondary | ICD-10-CM

## 2015-09-24 DIAGNOSIS — R0902 Hypoxemia: Secondary | ICD-10-CM | POA: Diagnosis present

## 2015-09-24 DIAGNOSIS — I50810 Right heart failure, unspecified: Secondary | ICD-10-CM

## 2015-09-24 LAB — URINALYSIS, ROUTINE W REFLEX MICROSCOPIC
BILIRUBIN URINE: NEGATIVE
GLUCOSE, UA: NEGATIVE mg/dL
HGB URINE DIPSTICK: NEGATIVE
KETONES UR: 40 mg/dL — AB
Leukocytes, UA: NEGATIVE
Nitrite: NEGATIVE
PH: 6 (ref 5.0–8.0)
PROTEIN: NEGATIVE mg/dL
Specific Gravity, Urine: 1.013 (ref 1.005–1.030)

## 2015-09-24 LAB — CBG MONITORING, ED: Glucose-Capillary: 133 mg/dL — ABNORMAL HIGH (ref 65–99)

## 2015-09-24 MED ORDER — OXYCODONE-ACETAMINOPHEN 5-325 MG PO TABS
1.0000 | ORAL_TABLET | Freq: Once | ORAL | Status: AC
Start: 2015-09-24 — End: 2015-09-24
  Administered 2015-09-24: 1 via ORAL
  Filled 2015-09-24: qty 1

## 2015-09-24 MED ORDER — SODIUM CHLORIDE 0.9 % IV BOLUS (SEPSIS)
1000.0000 mL | Freq: Once | INTRAVENOUS | Status: AC
  Administered 2015-09-24: 1000 mL via INTRAVENOUS

## 2015-09-24 NOTE — ED Triage Notes (Signed)
Pt BIB EMS from home. 2 days post hernia repair. Returnned home today. Had syncopal episode while inpatient here, one episode at home. Began feeling weak, vomited, then had syncopal episode. Witnessed by wife. Denies fall as he was on couch. RBBB per EMS. Initial BP while sitting for EMS was 107/77, 96/70 while standing. Given NS by EMS.

## 2015-09-24 NOTE — ED Provider Notes (Signed)
[facility] DEPT Provider Note   CSN: [id number] Arrival date & time: 09/24/15  2246  By signing my name below, I, Emmanuella Mensah, attest that this documentation has been prepared under the direction and in the presence of Gilda Crease, MD. Electronically Signed: Angelene Giovanni, ED Scribe. 09/24/15. 11:55 PM.   History   Chief Complaint Chief Complaint  Patient presents with  . Loss of Consciousness   HPI Comments: Wayne Bolton is a 50 y.o. male with a hx of LBBB and near syncopal episodes brought in by ambulance, who presents to the Emergency Department complaining of a witnessed syncopal episode that lasted for approx 5 minutes occurring several hours ago s/p hernia repair 2 days ago. Wife explains that pt's eyes rolled back and he was having a "choking-type" breathing sensation. No head injuries sustained during the episode. She adds that pt became nauseous after the episode and had one episode of non-bloody vomiting. No alleviating factors noted. Pt has not taken any medications for these symptoms, however he is on Oxycodone every 4 hours. He states that he is currently having difficulty breathing due to the pain from his surgery and is on home O2. Pt explains that he had a near syncopal episode the first time he was in the recovery room after the surgery and stayed for fluids, pain medication, and Zofran. He adds that he was discharged yesterday at 2 pm where he has been resting all day. He denies any fever, chills, bladder incontinence, confusion, dizziness, or any weakness.  The history is provided by the patient and the spouse. No language interpreter was used.  Loss of Consciousness   This is a new problem. The problem has been resolved. He lost consciousness for a period of 1 to 5 minutes. The problem is associated with normal activity. Associated symptoms include nausea and vomiting. Pertinent negatives include bladder incontinence, confusion, dizziness, fever and  weakness. He has tried nothing for the symptoms.    Past Medical History:  Diagnosis Date  . Headache    history migraines none in last few years    Patient Active Problem List   Diagnosis Date Noted  . Hypoxemia   . Deep vein thrombosis (DVT) of proximal vein of both lower extremities (HCC)   . Acute saddle pulmonary embolism with acute cor pulmonale (HCC)   . Acute combined systolic and diastolic congestive heart failure (HCC)   . Syncope 09/25/2015  . Elevated troponin 09/25/2015  . Cardiomyopathy (HCC)   . Bilateral inguinal hernia 09/22/2015  . Near syncope 09/22/2015  . LBBB (left bundle branch block) 09/22/2015    Past Surgical History:  Procedure Laterality Date  . INGUINAL HERNIA REPAIR Bilateral 09/22/2015   Procedure: LAPAROSCOPIC BILATERAL INGUINAL HERNIA REPAIR WITH MESH;  Surgeon: Avel Peace, MD;  Location: [facility] OR;  Service: General;  Laterality: Bilateral;  . INSERTION OF MESH Bilateral 09/22/2015   Procedure: INSERTION OF MESH;  Surgeon: Avel Peace, MD;  Location: [facility] OR;  Service: General;  Laterality: Bilateral;  . IR GENERIC HISTORICAL  09/27/2015   IR ANGIOGRAM SELECTIVE EACH ADDITIONAL VESSEL 09/27/2015 Oley Balm, MD [facility]  . IR GENERIC HISTORICAL  09/27/2015   IR ANGIOGRAM SELECTIVE EACH ADDITIONAL VESSEL 09/27/2015 Oley Balm, MD [facility]  . IR GENERIC HISTORICAL  09/27/2015   IR INFUSION THROMBOL ARTERIAL INITIAL (MS) 09/27/2015 Oley Balm, MD [facility]  . IR GENERIC HISTORICAL  09/27/2015   IR US GUIDE VASC ACCESS RIGHT 09/27/2015 Oley Balm, MD [facility]  .  IR GENERIC HISTORICAL  09/27/2015   IR ANGIOGRAM PULMONARY BILATERAL SELECTIVE 09/27/2015 Oley Balm, MD [facility]  . IR GENERIC HISTORICAL  09/27/2015   IR INFUSION THROMBOL ARTERIAL INITIAL (MS) 09/27/2015 Oley Balm, MD [facility]  . IR GENERIC HISTORICAL  09/28/2015   IR THROMB F/U EVAL ART/VEN FINAL DAY (MS) 09/28/2015 Oley Balm, MD [facility]  . TONSILLECTOMY         Home Medications    Prior to Admission medications   Medication Sig Start Date End Date Taking? Authorizing Provider  ibuprofen (ADVIL,MOTRIN) 200 MG tablet Take 400 mg by mouth every 6 (six) hours as needed for mild pain.   Yes Historical Provider, MD  oxyCODONE (OXY IR/ROXICODONE) 5 MG immediate release tablet Take 1-2 tablets (5-10 mg total) by mouth every 4 (four) hours as needed for moderate pain, severe pain or breakthrough pain. 09/22/15  Yes Avel Peace, MD  apixaban (ELIQUIS) 5 MG TABS tablet Take 1-2 tablets (5-10 mg total) by mouth 2 (two) times daily. Take 2 tablets (10mg ) 2 times daily x 6 days, then start 1 tablet(5mg ) 2 times daily on 10/06/2015 09/30/15   Rodolph Bong, MD  potassium chloride SA (K-DUR,KLOR-CON) 20 MEQ tablet Take 2 tablets (40 mEq total) by mouth daily. Take for 3 days then stop. 10/01/15 10/04/15  Rodolph Bong, MD    Family History Family History  Problem Relation Age of Onset  . Other Paternal Grandfather 52    felt fine, came home from dinner, found dead, no autopsy    Social History Social History  Substance Use Topics  . Smoking status: Never Smoker  . Smokeless tobacco: Never Used  . Alcohol use Yes     Comment: occasionally     Allergies   No known allergies   Review of Systems Review of Systems  Constitutional: Negative for chills and fever.  Cardiovascular: Positive for syncope.  Gastrointestinal: Positive for nausea and vomiting.  Genitourinary: Negative for bladder incontinence.  Neurological: Positive for syncope. Negative for dizziness and weakness.  Psychiatric/Behavioral: Negative for confusion.  All other systems reviewed and are negative.    Physical Exam Updated Vital Signs BP 122/73 (BP Location: Right Arm)   Pulse 80   Temp 99.3 F (37.4 C) (Oral)   Resp 20   Ht 6\' 1"  (1.854 m)   Wt 198 lb 3.2 oz (89.9 kg) Comment: c scale  SpO2 100%   BMI 26.15 kg/m   Physical  Exam  Constitutional: He is oriented to person, place, and time. He appears well-developed and well-nourished. No distress.  HENT:  Head: Normocephalic and atraumatic.  Right Ear: Hearing normal.  Left Ear: Hearing normal.  Nose: Nose normal.  Mouth/Throat: Oropharynx is clear and moist and mucous membranes are normal.  Eyes: Conjunctivae and EOM are normal. Pupils are equal, round, and reactive to light.  Neck: Normal range of motion. Neck supple.  Cardiovascular: Regular rhythm, S1 normal and S2 normal.  Exam reveals no gallop and no friction rub.   No murmur heard. Pulmonary/Chest: Effort normal and breath sounds normal. No respiratory distress. He exhibits no tenderness.  Abdominal: Soft. Normal appearance and bowel sounds are normal. He exhibits distension. There is no hepatosplenomegaly. There is tenderness. There is no rebound, no guarding, no tenderness at McBurney's point and negative Murphy's sign. No hernia.  Slight distension, surgical dressing intact without drainage. Mild tenderness.   Musculoskeletal: Normal range of motion.  Neurological: He is alert and oriented to person, place,  and time. He has normal strength. No cranial nerve deficit or sensory deficit. Coordination normal. GCS eye subscore is 4. GCS verbal subscore is 5. GCS motor subscore is 6.  Skin: Skin is warm, dry and intact. No rash noted. No cyanosis.  Psychiatric: He has a normal mood and affect. His speech is normal and behavior is normal. Thought content normal.  Nursing note and vitals reviewed.    ED Treatments / Results  DIAGNOSTIC STUDIES: Oxygen Saturation is 91% on [place], low by my interpretation.    COORDINATION OF CARE: 11:32 PM- Pt advised of plan for treatment and pt agrees. Pt will receive lab work for further evaluation. He will also receive IV fluids and Percocet.    Labs (all labs ordered are listed, but only abnormal results are displayed) Labs Reviewed  BASIC METABOLIC PANEL - Abnormal;  Notable for the following:       Result Value   Potassium 3.2 (*)    Glucose, Bld 139 (*)    Calcium 8.8 (*)    All other components within normal limits  CBC - Abnormal; Notable for the following:    WBC 11.7 (*)    All other components within normal limits  URINALYSIS, ROUTINE W REFLEX MICROSCOPIC (NOT AT [facility]) - Abnormal; Notable for the following:    Ketones, ur 40 (*)    All other components within normal limits  TROPONIN I - Abnormal; Notable for the following:    Troponin I 0.20 (*)    All other components within normal limits  TROPONIN I - Abnormal; Notable for the following:    Troponin I 0.29 (*)    All other components within normal limits  TROPONIN I - Abnormal; Notable for the following:    Troponin I 0.31 (*)    All other components within normal limits  GLUCOSE, CAPILLARY - Abnormal; Notable for the following:    Glucose-Capillary 108 (*)    All other components within normal limits  BASIC METABOLIC PANEL - Abnormal; Notable for the following:    Calcium 8.7 (*)    All other components within normal limits  PROTIME-INR - Abnormal; Notable for the following:    Prothrombin Time 16.1 (*)    All other components within normal limits  BASIC METABOLIC PANEL - Abnormal; Notable for the following:    Calcium 8.7 (*)    All other components within normal limits  HEPATIC FUNCTION PANEL - Abnormal; Notable for the following:    Total Protein 5.9 (*)    Albumin 3.2 (*)    AST 57 (*)    ALT 83 (*)    All other components within normal limits  TSH - Abnormal; Notable for the following:    TSH 5.725 (*)    All other components within normal limits  BRAIN NATRIURETIC PEPTIDE - Abnormal; Notable for the following:    B Natriuretic Peptide 284.2 (*)    All other components within normal limits  GLUCOSE, CAPILLARY - Abnormal; Notable for the following:    Glucose-Capillary 101 (*)    All other components within normal limits  D-DIMER, QUANTITATIVE (NOT AT [facility]) -  Abnormal; Notable for the following:    D-Dimer, Quant 11.22 (*)    All other components within normal limits  BASIC METABOLIC PANEL - Abnormal; Notable for the following:    Chloride 100 (*)    Glucose, Bld 102 (*)    All other components within normal limits  FERRITIN - Abnormal; Notable for the following:  Ferritin 420 (*)    All other components within normal limits  CBC WITH DIFFERENTIAL/PLATELET - Abnormal; Notable for the following:    WBC 11.6 (*)    Neutro Abs 7.8 (*)    Monocytes Absolute 1.1 (*)    All other components within normal limits  HEPARIN LEVEL (UNFRACTIONATED) - Abnormal; Notable for the following:    Heparin Unfractionated 0.25 (*)    All other components within normal limits  GLUCOSE, CAPILLARY - Abnormal; Notable for the following:    Glucose-Capillary 106 (*)    All other components within normal limits  CBC - Abnormal; Notable for the following:    WBC 13.3 (*)    All other components within normal limits  CBC - Abnormal; Notable for the following:    WBC 13.8 (*)    All other components within normal limits  CBC - Abnormal; Notable for the following:    WBC 13.0 (*)    All other components within normal limits  FIBRINOGEN - Abnormal; Notable for the following:    Fibrinogen 601 (*)    All other components within normal limits  FIBRINOGEN - Abnormal; Notable for the following:    Fibrinogen 585 (*)    All other components within normal limits  FIBRINOGEN - Abnormal; Notable for the following:    Fibrinogen 583 (*)    All other components within normal limits  GLUCOSE, CAPILLARY - Abnormal; Notable for the following:    Glucose-Capillary 102 (*)    All other components within normal limits  BASIC METABOLIC PANEL - Abnormal; Notable for the following:    Glucose, Bld 111 (*)    Calcium 8.6 (*)    All other components within normal limits  CBC - Abnormal; Notable for the following:    WBC 12.4 (*)    HCT 38.8 (*)    All other components  within normal limits  FIBRINOGEN - Abnormal; Notable for the following:    Fibrinogen 618 (*)    All other components within normal limits  GLUCOSE, CAPILLARY - Abnormal; Notable for the following:    Glucose-Capillary 109 (*)    All other components within normal limits  BASIC METABOLIC PANEL - Abnormal; Notable for the following:    Sodium 134 (*)    Glucose, Bld 100 (*)    All other components within normal limits  CBC - Abnormal; Notable for the following:    WBC 11.5 (*)    All other components within normal limits  BASIC METABOLIC PANEL - Abnormal; Notable for the following:    Sodium 132 (*)    CO2 21 (*)    Glucose, Bld 102 (*)    All other components within normal limits  CBC - Abnormal; Notable for the following:    WBC 11.5 (*)    Hemoglobin 12.9 (*)    HCT 38.6 (*)    All other components within normal limits  CBG MONITORING, ED - Abnormal; Notable for the following:    Glucose-Capillary 133 (*)    All other components within normal limits  POCT I-STAT 3, VENOUS BLOOD GAS (G3P V) - Abnormal; Notable for the following:    pCO2, Ven 40.4 (*)    pO2, Ven 28.0 (*)    All other components within normal limits  POCT I-STAT 3, VENOUS BLOOD GAS (G3P V) - Abnormal; Notable for the following:    pCO2, Ven 39.0 (*)    pO2, Ven 23.0 (*)    All other components within normal  limits  POCT I-STAT 3, ART BLOOD GAS (G3+) - Abnormal; Notable for the following:    pO2, Arterial 58.0 (*)    All other components within normal limits  POCT I-STAT 3, VENOUS BLOOD GAS (G3P V) - Abnormal; Notable for the following:    pCO2, Ven 36.9 (*)    pO2, Ven 26.0 (*)    All other components within normal limits  POCT I-STAT 3, VENOUS BLOOD GAS (G3P V) - Abnormal; Notable for the following:    pCO2, Ven 38.9 (*)    pO2, Ven 28.0 (*)    All other components within normal limits  MRSA PCR SCREENING  MAGNESIUM  TSH  CORTISOL-AM, BLOOD  CBC  T4, FREE  T3, FREE  ANTI-SCLERODERMA ANTIBODY    HEPATITIS PANEL, ACUTE  HIV ANTIBODY (ROUTINE TESTING)  RHEUMATOID FACTOR  HEPARIN LEVEL (UNFRACTIONATED)  HEPARIN LEVEL (UNFRACTIONATED)  HEPARIN LEVEL (UNFRACTIONATED)  HEPARIN LEVEL (UNFRACTIONATED)  HEPARIN LEVEL (UNFRACTIONATED)  HEPARIN LEVEL (UNFRACTIONATED)  GLUCOSE, CAPILLARY  FACTOR 5 LEIDEN  PROTHROMBIN GENE MUTATION    EKG  EKG Interpretation  Date/Time:  Thursday September 25 2015 01:58:40 EDT Ventricular Rate:  81 PR Interval:    QRS Duration: 157 QT Interval:  434 QTC Calculation: 504 R Axis:   -41 Text Interpretation:  Sinus rhythm Probable left atrial enlargement Left bundle branch block Baseline wander in lead(s) V1 V5 Confirmed by Krayton Wortley  MD, Johara Lodwick ([id number]) on 09/25/2015 3:29:31 AM       Radiology No results found.  Procedures Procedures (including critical care time)  Medications Ordered in ED Medications  potassium chloride 10 mEq in 100 mL IVPB (10 mEq Intravenous Given 09/25/15 1125)  lidocaine (XYLOCAINE) 1 % (with pres) injection (not administered)  iopamidol (ISOVUE-300) 61 % injection (not administered)  fentaNYL (SUBLIMAZE) 100 MCG/2ML injection (not administered)  midazolam (VERSED) 2 MG/2ML injection (not administered)  oxyCODONE-acetaminophen (PERCOCET/ROXICET) 5-325 MG per tablet 1 tablet (1 tablet Oral Given 09/24/15 2355)  sodium chloride 0.9 % bolus 1,000 mL (0 mLs Intravenous Stopped 09/25/15 0100)  sodium chloride 0.9 % bolus 1,000 mL (0 mLs Intravenous Stopped 09/25/15 0406)  ondansetron (ZOFRAN) injection 4 mg (4 mg Intravenous Given 09/25/15 0206)  potassium chloride 10 mEq in 100 mL IVPB (10 mEq Intravenous Given 09/25/15 1259)  gadobenate dimeglumine (MULTIHANCE) injection 30 mL (25 mLs Intravenous Contrast Given 09/26/15 0915)  iopamidol (ISOVUE-370) 76 % injection (100 mLs  Contrast Given 09/26/15 1610)  heparin bolus via infusion 2,000 Units (2,000 Units Intravenous Bolus from Bag 09/26/15 1736)  heparin bolus via infusion  1,600 Units (1,600 Units Intravenous Bolus from Bag 09/27/15 0055)  alteplase (tPA/ACTIVASE) 6 mg in sodium chloride 0.9 % 238 mL (6 mg Intravenous Given 09/27/15 1300)  alteplase (tPA/ACTIVASE) 6 mg in sodium chloride 0.9 % 238 mL (6 mg Intravenous Given 09/27/15 1300)  midazolam (VERSED) injection (1 mg Intravenous Given 09/27/15 1158)  fentaNYL (SUBLIMAZE) injection (50 mcg Intravenous Given 09/27/15 1158)  lidocaine (XYLOCAINE) 1 % (with pres) injection (10 mLs Infiltration Given 09/27/15 1213)     Initial Impression / Assessment and Plan / ED Course  Gilda Crease, MD has reviewed the triage vital signs and the nursing notes.  Pertinent labs & imaging results that were available during my care of the patient were reviewed by me and considered in my medical decision making (see chart for details).  Clinical Course    Patient presents to the emergency department for evaluation of syncope. Patient had hernia repair surgery performed 2 days  prior to arrival. Patient reportedly had a syncopal episode in PACU and was hospitalized overnight. Since going home has continued to be weak and had a second episode prior to arrival in the ER. Patient continues to feel poorly. Attempts to sit him up and ambulate resulted in increased heart rate concerning for orthostasis and recurrent near syncope. This is identical to symptoms that occurred at home prior to arrival in the ER and immediately postoperatively in PACU. Patient to be admitted for further evaluation.  Final Clinical Impressions(s) / ED Diagnoses   Final diagnoses:  Syncope  Cardiomyopathy (HCC)  RVF (right ventricular failure) (HCC)  Edema  Pulmonary embolism (HCC)  Acute saddle pulmonary embolism with acute cor pulmonale (HCC)    New Prescriptions Discharge Medication List as of 09/30/2015 12:22 PM    START taking these medications   Details  potassium chloride SA (K-DUR,KLOR-CON) 20 MEQ tablet Take 2 tablets (40 mEq total) by  mouth daily. Take for 3 days then stop., Starting Wed 10/01/2015, Until Sat 10/04/2015, Print       I personally performed the services described in this documentation, which was scribed in my presence. The recorded information has been reviewed and is accurate.     Gilda Crease, MD 10/02/15 0001

## 2015-09-25 ENCOUNTER — Encounter (HOSPITAL_COMMUNITY)
Admission: EM | Disposition: A | Discharge status: Home / Self Care | Payer: Self-pay | Source: Home / Self Care | Attending: Internal Medicine

## 2015-09-25 ENCOUNTER — Observation Stay (HOSPITAL_BASED_OUTPATIENT_CLINIC_OR_DEPARTMENT_OTHER): Payer: 59

## 2015-09-25 ENCOUNTER — Encounter (HOSPITAL_COMMUNITY): Payer: Self-pay | Admitting: General Practice

## 2015-09-25 DIAGNOSIS — R7989 Other specified abnormal findings of blood chemistry: Secondary | ICD-10-CM

## 2015-09-25 DIAGNOSIS — I447 Left bundle-branch block, unspecified: Secondary | ICD-10-CM

## 2015-09-25 DIAGNOSIS — I5021 Acute systolic (congestive) heart failure: Secondary | ICD-10-CM

## 2015-09-25 DIAGNOSIS — R778 Other specified abnormalities of plasma proteins: Secondary | ICD-10-CM | POA: Diagnosis present

## 2015-09-25 DIAGNOSIS — R55 Syncope and collapse: Secondary | ICD-10-CM | POA: Diagnosis present

## 2015-09-25 DIAGNOSIS — I2510 Atherosclerotic heart disease of native coronary artery without angina pectoris: Secondary | ICD-10-CM | POA: Diagnosis not present

## 2015-09-25 DIAGNOSIS — I420 Dilated cardiomyopathy: Secondary | ICD-10-CM | POA: Diagnosis not present

## 2015-09-25 DIAGNOSIS — I429 Cardiomyopathy, unspecified: Secondary | ICD-10-CM | POA: Diagnosis present

## 2015-09-25 LAB — ECHOCARDIOGRAM COMPLETE
EWDT: 197 ms
FS: 25 % — AB (ref 28–44)
Height: 73 in
IVS/LV PW RATIO, ED: 1
LA vol: 41.6 mL
LADIAMINDEX: 1.41 cm/m2
LASIZE: 32 mm
LAVOLA4C: 31.4 mL
LAVOLIN: 18.3 mL/m2
LEFT ATRIUM END SYS DIAM: 32 mm
LV PW d: 13 mm — AB (ref 0.6–1.1)
LV TDI E'LATERAL: 6.85
LV TDI E'MEDIAL: 5.33
LVELAT: 6.85 cm/s
LVOT area: 3.14 cm2
LVOTD: 20 mm
MV Dec: 197
MV pk E vel: 1.4 m/s
RV TAPSE: 12.9 mm
RV sys press: 35 mmHg
Reg peak vel: 285 cm/s
TR max vel: 285 cm/s
Weight: 3476.8 oz

## 2015-09-25 LAB — POCT I-STAT 3, VENOUS BLOOD GAS (G3P V)
ACID-BASE DEFICIT: 1 mmol/L (ref 0.0–2.0)
ACID-BASE DEFICIT: 1 mmol/L (ref 0.0–2.0)
Acid-base deficit: 2 mmol/L (ref 0.0–2.0)
BICARBONATE: 23.5 mmol/L (ref 20.0–28.0)
BICARBONATE: 23.5 mmol/L (ref 20.0–28.0)
Bicarbonate: 23.2 mmol/L (ref 20.0–28.0)
O2 SAT: 41 %
O2 SAT: 48 %
O2 SAT: 51 %
PCO2 VEN: 39 mmHg — AB (ref 44.0–60.0)
PCO2 VEN: 40.4 mmHg — AB (ref 44.0–60.0)
PO2 VEN: 23 mmHg — AB (ref 32.0–45.0)
PO2 VEN: 26 mmHg — AB (ref 32.0–45.0)
PO2 VEN: 28 mmHg — AB (ref 32.0–45.0)
TCO2: 24 mmol/L (ref 0–100)
TCO2: 25 mmol/L (ref 0–100)
TCO2: 25 mmol/L (ref 0–100)
pCO2, Ven: 36.9 mmHg — ABNORMAL LOW (ref 44.0–60.0)
pH, Ven: 7.372 (ref 7.250–7.430)
pH, Ven: 7.388 (ref 7.250–7.430)
pH, Ven: 7.406 (ref 7.250–7.430)

## 2015-09-25 LAB — BASIC METABOLIC PANEL
ANION GAP: 8 (ref 5–15)
ANION GAP: 8 (ref 5–15)
BUN: 7 mg/dL (ref 6–20)
BUN: 7 mg/dL (ref 6–20)
CO2: 24 mmol/L (ref 22–32)
CO2: 25 mmol/L (ref 22–32)
Calcium: 8.7 mg/dL — ABNORMAL LOW (ref 8.9–10.3)
Calcium: 8.8 mg/dL — ABNORMAL LOW (ref 8.9–10.3)
Chloride: 105 mmol/L (ref 101–111)
Chloride: 109 mmol/L (ref 101–111)
Creatinine, Ser: 0.84 mg/dL (ref 0.61–1.24)
Creatinine, Ser: 0.84 mg/dL (ref 0.61–1.24)
GFR calc Af Amer: >60 mL/min (ref >60)
GFR calc Af Amer: >60 mL/min (ref >60)
GFR calc non Af Amer: >60 mL/min (ref >60)
GLUCOSE: 99 mg/dL (ref 65–99)
GLUCOSE: 139 mg/dL — AB (ref 65–99)
POTASSIUM: 3.9 mmol/L (ref 3.5–5.1)
POTASSIUM: 3.2 mmol/L — AB (ref 3.5–5.1)
SODIUM: 137 mmol/L (ref 135–145)
Sodium: 142 mmol/L (ref 135–145)

## 2015-09-25 LAB — POCT I-STAT 3, ART BLOOD GAS (G3+)
Acid-base deficit: 2 mmol/L (ref 0.0–2.0)
BICARBONATE: 21.4 mmol/L (ref 20.0–28.0)
O2 SAT: 91 %
TCO2: 22 mmol/L (ref 0–100)
pCO2 arterial: 32.1 mmHg (ref 32.0–48.0)
pH, Arterial: 7.433 (ref 7.350–7.450)
pO2, Arterial: 58 mmHg — ABNORMAL LOW (ref 83.0–108.0)

## 2015-09-25 LAB — GLUCOSE, CAPILLARY: GLUCOSE-CAPILLARY: 108 mg/dL — AB (ref 65–99)

## 2015-09-25 LAB — TROPONIN I
TROPONIN I: 0.29 ng/mL — AB (ref <0.03)
TROPONIN I: 0.31 ng/mL — AB (ref <0.03)
Troponin I: 0.2 ng/mL (ref <0.03)

## 2015-09-25 LAB — CBC
HEMATOCRIT: 42.2 % (ref 39.0–52.0)
HEMOGLOBIN: 13.8 g/dL (ref 13.0–17.0)
MCH: 30.8 pg (ref 26.0–34.0)
MCHC: 32.7 g/dL (ref 30.0–36.0)
MCV: 94.2 fL (ref 78.0–100.0)
Platelets: 196 10*3/uL (ref 150–400)
RBC: 4.48 MIL/uL (ref 4.22–5.81)
RDW: 12.5 % (ref 11.5–15.5)
WBC: 11.7 10*3/uL — AB (ref 4.0–10.5)

## 2015-09-25 LAB — PROTIME-INR
INR: 1.28
Prothrombin Time: 16.1 seconds — ABNORMAL HIGH (ref 11.4–15.2)

## 2015-09-25 LAB — MAGNESIUM: Magnesium: 1.9 mg/dL (ref 1.7–2.4)

## 2015-09-25 LAB — TSH: TSH: 2.323 u[IU]/mL (ref 0.350–4.500)

## 2015-09-25 SURGERY — RIGHT HEART CATH AND CORONARY ANGIOGRAPHY

## 2015-09-25 MED ORDER — HEPARIN (PORCINE) IN NACL 2-0.9 UNIT/ML-% IJ SOLN
INTRAMUSCULAR | Status: AC
  Filled 2015-09-25: qty 1000

## 2015-09-25 MED ORDER — SODIUM CHLORIDE 0.9 % WEIGHT BASED INFUSION
3.0000 mL/kg/h | INTRAVENOUS | Status: DC
  Administered 2015-09-25: 3 mL/kg/h via INTRAVENOUS

## 2015-09-25 MED ORDER — SODIUM CHLORIDE 0.9 % WEIGHT BASED INFUSION
1.0000 mL/kg/h | INTRAVENOUS | Status: DC
  Administered 2015-09-25: 1 mL/kg/h via INTRAVENOUS

## 2015-09-25 MED ORDER — PROMETHAZINE HCL 25 MG RE SUPP
25.0000 mg | Freq: Four times a day (QID) | RECTAL | Status: DC | PRN
  Filled 2015-09-25: qty 1

## 2015-09-25 MED ORDER — IOPAMIDOL (ISOVUE-370) INJECTION 76%
INTRAVENOUS | Status: AC
  Filled 2015-09-25: qty 100

## 2015-09-25 MED ORDER — LOSARTAN POTASSIUM 25 MG PO TABS
12.5000 mg | ORAL_TABLET | Freq: Two times a day (BID) | ORAL | Status: DC
  Administered 2015-09-25 – 2015-09-26 (×3): 12.5 mg via ORAL
  Filled 2015-09-25 (×3): qty 1

## 2015-09-25 MED ORDER — ENOXAPARIN SODIUM 40 MG/0.4ML ~~LOC~~ SOLN
40.0000 mg | Freq: Every day | SUBCUTANEOUS | Status: DC
  Administered 2015-09-25 – 2015-09-26 (×2): 40 mg via SUBCUTANEOUS
  Filled 2015-09-25 (×2): qty 0.4

## 2015-09-25 MED ORDER — SODIUM CHLORIDE 0.9% FLUSH
3.0000 mL | Freq: Two times a day (BID) | INTRAVENOUS | Status: DC
Start: 2015-09-25 — End: 2015-09-25

## 2015-09-25 MED ORDER — VERAPAMIL HCL 2.5 MG/ML IV SOLN
INTRAVENOUS | Status: AC
Start: 2015-09-25 — End: 2015-09-25
  Filled 2015-09-25: qty 2

## 2015-09-25 MED ORDER — MIDAZOLAM HCL 2 MG/2ML IJ SOLN
INTRAMUSCULAR | Status: DC | PRN
  Administered 2015-09-25 (×2): 1 mg via INTRAVENOUS

## 2015-09-25 MED ORDER — VERAPAMIL HCL 2.5 MG/ML IV SOLN: INTRAVENOUS | Status: DC | PRN

## 2015-09-25 MED ORDER — VERAPAMIL HCL 2.5 MG/ML IV SOLN
INTRAVENOUS | Status: DC | PRN
  Administered 2015-09-25: 15:00:00 via INTRA_ARTERIAL

## 2015-09-25 MED ORDER — FENTANYL CITRATE (PF) 100 MCG/2ML IJ SOLN
INTRAMUSCULAR | Status: AC
  Filled 2015-09-25: qty 2

## 2015-09-25 MED ORDER — PROMETHAZINE HCL 25 MG PO TABS: 25.0000 mg | ORAL_TABLET | Freq: Four times a day (QID) | ORAL | Status: DC | PRN

## 2015-09-25 MED ORDER — FUROSEMIDE 10 MG/ML IJ SOLN: 20.0000 mg | Freq: Once | INTRAMUSCULAR | Status: DC

## 2015-09-25 MED ORDER — HEPARIN (PORCINE) IN NACL 2-0.9 UNIT/ML-% IJ SOLN
INTRAMUSCULAR | Status: DC | PRN
  Administered 2015-09-25: 1000 mL

## 2015-09-25 MED ORDER — SODIUM CHLORIDE 0.9 % IV SOLN
INTRAVENOUS | Status: DC
  Administered 2015-09-25: 04:00:00 via INTRAVENOUS
  Administered 2015-09-25: 100 mL/h via INTRAVENOUS

## 2015-09-25 MED ORDER — ASPIRIN 81 MG PO CHEW
324.0000 mg | CHEWABLE_TABLET | ORAL | Status: DC
  Filled 2015-09-25: qty 4

## 2015-09-25 MED ORDER — REGADENOSON 0.4 MG/5ML IV SOLN
INTRAVENOUS | Status: AC
  Filled 2015-09-25: qty 5

## 2015-09-25 MED ORDER — SODIUM CHLORIDE 0.9 % IV BOLUS (SEPSIS)
1000.0000 mL | Freq: Once | INTRAVENOUS | Status: AC
  Administered 2015-09-25: 1000 mL via INTRAVENOUS

## 2015-09-25 MED ORDER — SODIUM CHLORIDE 0.9% FLUSH: 3.0000 mL | INTRAVENOUS | Status: DC | PRN

## 2015-09-25 MED ORDER — SODIUM CHLORIDE 0.9 % IV SOLN: 250.0000 mL | INTRAVENOUS | Status: DC | PRN

## 2015-09-25 MED ORDER — OXYCODONE HCL 5 MG PO TABS
5.0000 mg | ORAL_TABLET | ORAL | Status: DC | PRN
  Administered 2015-09-25: 5 mg via ORAL
  Filled 2015-09-25: qty 1

## 2015-09-25 MED ORDER — POTASSIUM CHLORIDE 10 MEQ/100ML IV SOLN
10.0000 meq | INTRAVENOUS | Status: AC
  Administered 2015-09-25: 10 meq via INTRAVENOUS

## 2015-09-25 MED ORDER — LISINOPRIL 2.5 MG PO TABS: 2.5000 mg | ORAL_TABLET | Freq: Every day | ORAL | Status: DC

## 2015-09-25 MED ORDER — SODIUM CHLORIDE 0.9% FLUSH
3.0000 mL | Freq: Two times a day (BID) | INTRAVENOUS | Status: DC
  Administered 2015-09-25 – 2015-09-27 (×3): 3 mL via INTRAVENOUS

## 2015-09-25 MED ORDER — SODIUM CHLORIDE 0.9% FLUSH
3.0000 mL | Freq: Two times a day (BID) | INTRAVENOUS | Status: DC
  Administered 2015-09-25 – 2015-09-30 (×6): 3 mL via INTRAVENOUS

## 2015-09-25 MED ORDER — ONDANSETRON HCL 4 MG/2ML IJ SOLN
4.0000 mg | Freq: Once | INTRAMUSCULAR | Status: AC
  Administered 2015-09-25: 4 mg via INTRAVENOUS
  Filled 2015-09-25: qty 2

## 2015-09-25 MED ORDER — VERAPAMIL HCL 2.5 MG/ML IV SOLN
INTRAVENOUS | Status: DC | PRN
  Administered 2015-09-25: 10 mL via INTRA_ARTERIAL

## 2015-09-25 MED ORDER — FUROSEMIDE 10 MG/ML IJ SOLN
20.0000 mg | Freq: Two times a day (BID) | INTRAMUSCULAR | Status: DC
  Administered 2015-09-25 – 2015-09-27 (×4): 20 mg via INTRAVENOUS
  Filled 2015-09-25 (×5): qty 2

## 2015-09-25 MED ORDER — REGADENOSON 0.4 MG/5ML IV SOLN
0.4000 mg | Freq: Once | INTRAVENOUS | Status: DC
  Filled 2015-09-25: qty 5

## 2015-09-25 MED ORDER — IOPAMIDOL (ISOVUE-370) INJECTION 76%
INTRAVENOUS | Status: DC | PRN
  Administered 2015-09-25: 45 mL via INTRA_ARTERIAL

## 2015-09-25 MED ORDER — SPIRONOLACTONE 25 MG PO TABS
12.5000 mg | ORAL_TABLET | Freq: Every day | ORAL | Status: DC
  Administered 2015-09-25 – 2015-09-26 (×2): 12.5 mg via ORAL
  Filled 2015-09-25 (×2): qty 1

## 2015-09-25 MED ORDER — ACETAMINOPHEN 325 MG PO TABS
650.0000 mg | ORAL_TABLET | ORAL | Status: DC | PRN
  Administered 2015-09-29: 650 mg via ORAL
  Filled 2015-09-25: qty 2

## 2015-09-25 MED ORDER — IBUPROFEN 200 MG PO TABS: 400.0000 mg | ORAL_TABLET | Freq: Four times a day (QID) | ORAL | Status: DC | PRN

## 2015-09-25 MED ORDER — LIDOCAINE HCL (PF) 1 % IJ SOLN
INTRAMUSCULAR | Status: AC
  Filled 2015-09-25: qty 30

## 2015-09-25 MED ORDER — LIDOCAINE HCL (PF) 1 % IJ SOLN
INTRAMUSCULAR | Status: DC | PRN
  Administered 2015-09-25 (×2): 1 mL via SUBCUTANEOUS

## 2015-09-25 MED ORDER — HEPARIN SODIUM (PORCINE) 1000 UNIT/ML IJ SOLN
INTRAMUSCULAR | Status: DC | PRN
  Administered 2015-09-25: 5000 [IU] via INTRAVENOUS

## 2015-09-25 MED ORDER — PROMETHAZINE HCL 25 MG/ML IJ SOLN
12.5000 mg | Freq: Four times a day (QID) | INTRAMUSCULAR | Status: DC | PRN
  Administered 2015-09-25: 25 mg via INTRAVENOUS
  Filled 2015-09-25: qty 1

## 2015-09-25 MED ORDER — FENTANYL CITRATE (PF) 100 MCG/2ML IJ SOLN
INTRAMUSCULAR | Status: DC | PRN
  Administered 2015-09-25: 25 ug via INTRAVENOUS
  Administered 2015-09-25: 50 ug via INTRAVENOUS

## 2015-09-25 MED ORDER — POTASSIUM CHLORIDE CRYS ER 20 MEQ PO TBCR
40.0000 meq | EXTENDED_RELEASE_TABLET | Freq: Every day | ORAL | Status: DC
Start: 2015-09-25 — End: 2015-09-30
  Administered 2015-09-25 – 2015-09-30 (×6): 40 meq via ORAL
  Filled 2015-09-25 (×6): qty 2

## 2015-09-25 MED ORDER — DIGOXIN 125 MCG PO TABS
0.1250 mg | ORAL_TABLET | Freq: Every day | ORAL | Status: DC
  Administered 2015-09-25 – 2015-09-26 (×2): 0.125 mg via ORAL
  Filled 2015-09-25 (×3): qty 1

## 2015-09-25 MED ORDER — POTASSIUM CHLORIDE 10 MEQ/100ML IV SOLN
10.0000 meq | INTRAVENOUS | Status: AC
  Administered 2015-09-25 (×3): 10 meq via INTRAVENOUS
  Filled 2015-09-25 (×4): qty 100

## 2015-09-25 MED ORDER — MIDAZOLAM HCL 2 MG/2ML IJ SOLN
INTRAMUSCULAR | Status: AC
  Filled 2015-09-25: qty 2

## 2015-09-25 SURGICAL SUPPLY — 13 items
CATH BALLN WEDGE 5F 110CM (CATHETERS) ×3 IMPLANT
CATH IMPULSE 5F ANG/FL3.5 (CATHETERS) ×3 IMPLANT
DEVICE RAD COMP TR BAND LRG (VASCULAR PRODUCTS) ×3 IMPLANT
GLIDESHEATH SLEND SS 6F .021 (SHEATH) ×3 IMPLANT
GUIDEWIRE .025 260CM (WIRE) ×3 IMPLANT
HOVERMATT SINGLE USE (MISCELLANEOUS) ×3 IMPLANT
KIT HEART LEFT (KITS) ×3 IMPLANT
PACK CARDIAC CATHETERIZATION (CUSTOM PROCEDURE TRAY) ×3 IMPLANT
SHEATH FAST CATH BRACH 5F 5CM (SHEATH) ×3 IMPLANT
TRANSDUCER W/STOPCOCK (MISCELLANEOUS) ×3 IMPLANT
TUBING CIL FLEX 10 FLL-RA (TUBING) ×3 IMPLANT
WIRE HI TORQ VERSACORE-J 145CM (WIRE) ×3 IMPLANT
WIRE SAFE-T 1.5MM-J .035X260CM (WIRE) ×3 IMPLANT

## 2015-09-25 NOTE — Progress Notes (Signed)
    Heart cath  No CAD (NICM) Bi-V failure CI - 1.7 RVEDP 24 Wedge 28  Consulted advanced heart failure team.  Will start Dig 125 Losartan 12.5 BID Spironolactone 12.5 QD  Hold on Bb until euvolemic  Plan on Cardiac MRI possibly tomorrow.   Donato SchultzMark Makenna Macaluso, MD

## 2015-09-25 NOTE — Consult Note (Signed)
CARDIOLOGY CONSULT NOTE   Patient ID: Wayne Bolton MRN: [id number] DOB/AGE: 03/26/1965 50 y.o.  Admit date: 09/24/2015  Primary Physician   No PCP Per Patient Primary Cardiologist   Dr [name] Reason for Consultation   Syncope, elevated troponin Requesting MD: Dr Rito Ehrlich  Wayne Bolton is a 50 y.o. year old male with a history of migraines assoc w/ N&V, ECG >10 yr ago ok.  09/18 had bilateral hernia surgery>>ECG w/ LBBB>>orthostatic syncope>>seen by cards in PACU, hydration given, salt liberalized, outpatient echo and MV planned. He was held overnight and discharged 09/19 in am.  After discharge he did well initially. The next morning, he got up, showered, was ambulating around the house, felt only tired. He rested, and when he got up, he went to the bathroom, but was not straining. He again felt lightheaded and called his wife. He ambulated about 15 feet with assistance but got steadily worse and lost consciousness when he got to the chair. His wife describes some jerking and minimal snoring respirations.  She called EMS and he woke before they got there. He vomited and felt a little better. In the ER, orthostatic VS were positive and he was hydrated. When he ambulated to see if he could be discharged, the same symptoms happened again with decreasing LOC>>jerking and snoring resp.>>complete loss of consciousness.   He was admitted and has been getting IVF since then. His K+ was low this am at 3.2 and is being supplemented. Today, orthostatic VS were mildly abnormal, he reports being a little light-headed. His troponin's were mildly elevated, but he has had no chest pain.  He has had some groin pain from the surgery, has taken the percocet as prescribed. He has also gotten Zofran and Phenergan for nausea.   He has a long history of presyncope in response to things like blood draws, and being overheated, felt vasovagal.   Past Medical History:  Diagnosis Date  . Headache     history migraines none in last few years     Past Surgical History:  Procedure Laterality Date  . INGUINAL HERNIA REPAIR Bilateral 09/22/2015   Procedure: LAPAROSCOPIC BILATERAL INGUINAL HERNIA REPAIR WITH MESH;  Surgeon: Avel Peace, MD;  Location: [facility] OR;  Service: General;  Laterality: Bilateral;  . INSERTION OF MESH Bilateral 09/22/2015   Procedure: INSERTION OF MESH;  Surgeon: Avel Peace, MD;  Location: [facility] OR;  Service: General;  Laterality: Bilateral;  . TONSILLECTOMY      Allergies  Allergen Reactions  . No Known Allergies     I have reviewed the patient's current medications . enoxaparin (LOVENOX) injection  40 mg Subcutaneous Daily  . regadenoson  0.4 mg Intravenous Once  . sodium chloride flush  3 mL Intravenous Q12H   . sodium chloride 125 mL/hr at 09/25/15 0700   oxyCODONE, promethazine **OR** promethazine **OR** promethazine  Prior to Admission medications   Medication Sig Start Date End Date Taking? Authorizing Provider  ibuprofen (ADVIL,MOTRIN) 200 MG tablet Take 400 mg by mouth every 6 (six) hours as needed for mild pain.   Yes Historical Provider, MD  oxyCODONE (OXY IR/ROXICODONE) 5 MG immediate release tablet Take 1-2 tablets (5-10 mg total) by mouth every 4 (four) hours as needed for moderate pain, severe pain or breakthrough pain. 09/22/15  Yes Avel Peace, MD     Social History   Social History  . Marital status: Married    Spouse name: N/A  . Number of children: N/A  .  Years of education: N/A   Occupational History  . Acupuncturist    Social History Main Topics  . Smoking status: Never Smoker  . Smokeless tobacco: Never Used  . Alcohol use Yes     Comment: occasionally  . Drug use: No  . Sexual activity: Not on file   Other Topics Concern  . Not on file   Social History Narrative   Lives with wife in [place], 2 children in college.    Family Status  Relation Status  . Mother Alive  . Father Alive  . Paternal  Grandfather Deceased   Family History  Problem Relation Age of Onset  . Other Paternal Grandfather 54    felt fine, came home from dinner, found dead, no autopsy     ROS:  Full 14 point review of systems complete and found to be negative unless listed above.  Physical Exam: Blood pressure 120/85, pulse 87, temperature 97.3 F (36.3 C), temperature source Oral, resp. rate 20, height 6\' 1"  (1.854 m), weight 217 lb 4.8 oz (98.6 kg), SpO2 98 %.  General: Well developed, well nourished, male in no acute distress Head: Eyes PERRLA, No xanthomas.   Normocephalic and atraumatic, oropharynx without edema or exudate. Dentition: good Lungs: clear bilaterally Heart: HRRR S1 S2, no rub/gallop, S1, S2 no murmur. pulses are 2+ all 4 extrem.   Neck: No carotid bruits. No lymphadenopathy.  JVD not elevated Abdomen: Bowel sounds present, abdomen soft and tender near the incisions without masses noted. Msk:  No spine or cva tenderness. No weakness, no joint deformities or effusions. Extremities: No clubbing or cyanosis. No edema.  Neuro: Alert and oriented X 3. No focal deficits noted. Psych:  Good affect, responds appropriately Skin: No rashes or lesions noted.  Labs:   Lab Results  Component Value Date   WBC 11.7 (H) 09/24/2015   HGB 13.8 09/24/2015   HCT 42.2 09/24/2015   MCV 94.2 09/24/2015   PLT 196 09/24/2015     Recent Labs Lab 09/24/15 0020  NA 137  K 3.2*  CL 105  CO2 24  BUN 7  CREATININE 0.84  CALCIUM 8.8*  GLUCOSE 139*   No results found for: MG  Recent Labs  09/25/15 0352 09/25/15 0747  TROPONINI 0.20* 0.29*   Echo: 09/25/2015 - Left ventricle: The cavity size was normal. Systolic function was   moderately to severely reduced. The estimated ejection fraction   was in the range of 30% to 35%. Diffuse hypokinesis. Features are   consistent with a pseudonormal left ventricular filling pattern,   with concomitant abnormal relaxation and increased filling   pressure  (grade 2 diastolic dysfunction). - Ventricular septum: Septal motion showed abnormal function and   dyssynergy (LBBB). - Aortic valve: There was mild regurgitation. - Right ventricle: The cavity size was mildly dilated. Wall   thickness was normal. Systolic function was mildly reduced. - Right atrium: The atrium was mildly dilated. - Tricuspid valve: There was moderate regurgitation. - Pulmonary arteries: Systolic pressure was mildly increased. PA   peak pressure: 35 mm Hg (S).  ECG:  09/21 SR, LBBB, HR 81  ASSESSMENT AND PLAN:   The patient was seen today by Dr [name], the patient evaluated and the data reviewed.  Principal Problem:   Syncope - hx vasovagal response to stimulus - orthostatic VS initially positive, s/p hydration  Active Problems:   LBBB (left bundle branch block) - chronicity unclear, seen earlier this week    Elevated troponin -  may be secondary to recent surgery - but with syncope and abnormal echo, cath today - The risks and benefits of a cardiac catheterization including, but not limited to, death, stroke, MI, kidney damage and bleeding were discussed with the patient who indicates understanding and agrees to proceed.    SignedLeanna Battles: Barrett, Rhonda, PA-C 09/25/2015 11:47 AM Alvino ChapelBeeper [phone]  Co-Sign MD  Personally seen and examined. Agree with above.  50 year old status post hernia repair with recurrent near syncope/syncope, prodrome of diaphoresis, nausea compatible with vagal reaction, however EKG demonstrates left bundle branch block and echocardiogram on 09/25/15 showed reduced ejection fraction of 30-35%, dyssynchrony secondary to left bundle branch block and mildly dilated right ventricle with the reduced right ventricular function as well as, mild pulmonary hypertension.   - Given newly discovered cardiomyopathy, right and left heart catheterization radial/brachial approach.  - Risks and benefits of procedure including stroke, heart attack, renal  impairment, bleeding discussed. Willing to proceed.  - Continue to monitor on telemetry. No adverse arrhythmias thus far. I do not believe that his syncope is related to ventricular tachycardia given any history surrounding these events. He has been prone to syncope/presyncope for many many years and describes vagal reactions in the past. Pain syndrome in the femoral region also may precipitate increased vagal tone, i.e. similar to that when hold pressure post catheterization in groin eliciting a vagal response. Regardless, we need to exclude the possibility of ischemic cardiomyopathy.  - Low level troponin elevation likely secondary to cardiomyopathy and recent hypotensive episodes. Demand ischemia like.  - Could also consider cardiac MRI in the future given his RV dilatation and further clarify his cardiomyopathy.  Donato SchultzMark Skains, MD

## 2015-09-25 NOTE — H&P (Addendum)
History and Physical    Wayne Bolton ZOX:[id number] DOB: 23-Feb-1965 DOA: 09/24/2015   PCP: No PCP Per Patient Chief Complaint:  Chief Complaint  Patient presents with  . Loss of Consciousness    HPI: Wayne Bolton is a 50 y.o. male with medical history significant of recently admitted for B inguinal hernia repair on the 18th.  Patient had syncope after surgery.  Cardiology consulted, patient noted to have LBBB since before the surgery.  They felt that this was vasovagal, but did note that given the LBBB he needed 2d echo and stress test as outpatient.  He was discharged home.  Had continued vomiting.  Passed out again today at home and came to ED.  ED Course: In ED orthostatics are positive, RN proceeded to try and walk patient anyhow, and he passed out in hallway of ED.  Review of Systems: As per HPI otherwise 10 point review of systems negative.    Past Medical History:  Diagnosis Date  . Headache    history migraines none in last few years    Past Surgical History:  Procedure Laterality Date  . INGUINAL HERNIA REPAIR Bilateral 09/22/2015   Procedure: LAPAROSCOPIC BILATERAL INGUINAL HERNIA REPAIR WITH MESH;  Surgeon: Avel Peace, MD;  Location: [facility] OR;  Service: General;  Laterality: Bilateral;  . INSERTION OF MESH Bilateral 09/22/2015   Procedure: INSERTION OF MESH;  Surgeon: Avel Peace, MD;  Location[facility] OR;  Service: General;  Laterality: Bilateral;  . TONSILLECTOMY       reports that he has never smoked. He has never used smokeless tobacco. He reports that he drinks alcohol. He reports that he does not use drugs.  Allergies  Allergen Reactions  . No Known Allergies     Family History  Problem Relation Age of Onset  . Other Paternal Grandfather 38    felt fine, came home from dinner, found dead, no autopsy      Prior to Admission medications   Medication Sig Start Date End Date Taking? Authorizing Provider  ibuprofen (ADVIL,MOTRIN) 200 MG tablet Take  400 mg by mouth every 6 (six) hours as needed for mild pain.   Yes Historical Provider, MD  oxyCODONE (OXY IR/ROXICODONE) 5 MG immediate release tablet Take 1-2 tablets (5-10 mg total) by mouth every 4 (four) hours as needed for moderate pain, severe pain or breakthrough pain. 09/22/15  Yes Avel Peace, MD    Physical Exam: Vitals:   09/25/15 0000 09/25/15 0030 09/25/15 0115 09/25/15 0200  BP: 113/88 117/84 95/64 113/96  Pulse: 100 92 103 92  Resp: 21 22 19 23   Temp:      TempSrc:      SpO2: 94% 90% 96% 96%      Constitutional: NAD, calm, comfortable Eyes: PERRL, lids and conjunctivae normal ENMT: Mucous membranes are moist. Posterior pharynx clear of any exudate or lesions.Normal dentition.  Neck: normal, supple, no masses, no thyromegaly Respiratory: clear to auscultation bilaterally, no wheezing, no crackles. Normal respiratory effort. No accessory muscle use.  Cardiovascular: Regular rate and rhythm, no murmurs / rubs / gallops. No extremity edema. 2+ pedal pulses. No carotid bruits.  Abdomen: no tenderness, no masses palpated. No hepatosplenomegaly. Bowel sounds positive.  Musculoskeletal: no clubbing / cyanosis. No joint deformity upper and lower extremities. Good ROM, no contractures. Normal muscle tone.  Skin: no rashes, lesions, ulcers. No induration Neurologic: CN 2-12 grossly intact. Sensation intact, DTR normal. Strength 5/5 in all 4.  Psychiatric: Normal judgment and insight. Alert  and oriented x 3. Normal mood.    Labs on Admission: I have personally reviewed following labs and imaging studies  CBC:  Recent Labs Lab 09/22/15 1432 09/24/15 0020  WBC 17.2* 11.7*  HGB 14.3 13.8  HCT 42.7 42.2  MCV 92.8 94.2  PLT 274 196   Basic Metabolic Panel:  Recent Labs Lab 09/24/15 0020  NA 137  K 3.2*  CL 105  CO2 24  GLUCOSE 139*  BUN 7  CREATININE 0.84  CALCIUM 8.8*   GFR: Estimated Creatinine Clearance: 117.6 mL/min (by C-G formula based on SCr of 0.84  mg/dL). Liver Function Tests: No results for input(s): AST, ALT, ALKPHOS, BILITOT, PROT, ALBUMIN in the last 168 hours. No results for input(s): LIPASE, AMYLASE in the last 168 hours. No results for input(s): AMMONIA in the last 168 hours. Coagulation Profile: No results for input(s): INR, PROTIME in the last 168 hours. Cardiac Enzymes: No results for input(s): CKTOTAL, CKMB, CKMBINDEX, TROPONINI in the last 168 hours. BNP (last 3 results) No results for input(s): PROBNP in the last 8760 hours. HbA1C: No results for input(s): HGBA1C in the last 72 hours. CBG:  Recent Labs Lab 09/24/15 2338  GLUCAP 133*   Lipid Profile: No results for input(s): CHOL, HDL, LDLCALC, TRIG, CHOLHDL, LDLDIRECT in the last 72 hours. Thyroid Function Tests: No results for input(s): TSH, T4TOTAL, FREET4, T3FREE, THYROIDAB in the last 72 hours. Anemia Panel: No results for input(s): VITAMINB12, FOLATE, FERRITIN, TIBC, IRON, RETICCTPCT in the last 72 hours. Urine analysis:    Component Value Date/Time   COLORURINE YELLOW 09/24/2015 2311   APPEARANCEUR CLEAR 09/24/2015 2311   LABSPEC 1.013 09/24/2015 2311   PHURINE 6.0 09/24/2015 2311   GLUCOSEU NEGATIVE 09/24/2015 2311   HGBUR NEGATIVE 09/24/2015 2311   BILIRUBINUR NEGATIVE 09/24/2015 2311   KETONESUR 40 (A) 09/24/2015 2311   PROTEINUR NEGATIVE 09/24/2015 2311   NITRITE NEGATIVE 09/24/2015 2311   LEUKOCYTESUR NEGATIVE 09/24/2015 2311   Sepsis Labs: @LABRCNTIP (procalcitonin:4,lacticidven:4) )No results found for this or any previous visit (from the past 240 hour(s)).   Radiological Exams on Admission: No results found.  EKG: Independently reviewed.  Assessment/Plan Active Problems:   Syncope    1. Syncope - likely orthostatic due to persistent N/V, not responding to zofran.  Positive orthostatic vitals in the ED with a witnessed syncopal episode in ED. 1. Phenergan ordered 2. Will go ahead and get the 2d echo and stress test that they  wanted to get as outpatient anyhow since we are admitting him and he does have this LBBB (see cards note on the 19th) 3. NPO 4. IVF 5. Tele monitor   DVT prophylaxis: Lovenox Code Status: Full Family Communication: Family at bedside Consults called: None Admission status: Admit to obs   Mehtaab Mayeda, Heywood IlesJARED M. DO [facility] Pager [phone] from 7PM-7AM  If 7AM-7PM, please contact the day physician for the patient [email or web address] Password [email or web address]  09/25/2015, 3:21 AM

## 2015-09-25 NOTE — Progress Notes (Signed)
*  PRELIMINARY RESULTS* Echocardiogram 2D Echocardiogram has been performed.  Jeryl Columbialliott, Payam Gribble 09/25/2015, 11:24 AM

## 2015-09-25 NOTE — ED Notes (Signed)
Spoke to pts nurse and was told to ambulate pt if pt could stand for a couple minutes and not feel dizzy and nauseated. While ambulating pt, pt became dizzy and was sat in a chair. RN and Dr Blinda LeatherwoodPollina at side.

## 2015-09-25 NOTE — Progress Notes (Signed)
orthostsatics not done. Pt and wife refused stating pt not able to stand and will pass out

## 2015-09-25 NOTE — Progress Notes (Signed)
[facility] PROGRESS NOTE  Glendora ScoreMark A Prouty ZOX:[id number] DOB: 09/01/1965 DOA: 09/24/2015  PCP: No PCP Per Patient  Brief History/Interval Summary: 50 year old Caucasian male with a past medical history significant only for recent bilateral hernia repair, which was was done a few days ago. At that time patient was found to have LBBB on EKG. Plan was for the patient to undergo outpatient cardiac testing. He returned to the emergency department with episodes of syncope. He was hospitalized for further management.  Reason for Visit: Syncope and orthostatic hypotension  Consultants: Cardiology  Procedures:  Transthoracic echocardiogram Study Conclusions - Left ventricle: The cavity size was normal. There was mild concentric hypertrophy. Systolic function was moderately to severely reduced. The estimated ejection fraction was in the range of 30% to 35%. Diffuse hypokinesis. Features are consistent with a pseudonormal left ventricular filling pattern, with concomitant abnormal relaxation and increased filling pressure (grade 2 diastolic dysfunction). - Ventricular septum: Septal motion showed abnormal function and dyssynergy (LBBB). - Aortic valve: There was mild regurgitation. - Right ventricle: The cavity size was mildly dilated. Wall thickness was normal. Systolic function was mildly reduced. - Right atrium: The atrium was mildly dilated. - Tricuspid valve: There was moderate regurgitation. - Pulmonary arteries: Systolic pressure was mildly increased. PA peak pressure: 35 mm Hg (S).   Antibiotics: None  Subjective/Interval History: Patient continues to feel nauseated. This is especially when he stands up and does some physical activity. Denies any dizziness or lightheadedness this morning. No chest pain. No shortness of breath. His wife and parents were at the bedside.  ROS: Denies any headaches. No leg swelling  Objective:  Vital Signs  Vitals:   09/25/15 1503 09/25/15  1508 09/25/15 1513 09/25/15 1544  BP: (!) 139/98 (!) 137/102 (!) 137/104 (!) 141/89  Pulse: 83 74 83 84  Resp: 17 16 (!) 30 18  Temp:      TempSrc:      SpO2: 91% 93% 94%   Weight:      Height:        Intake/Output Summary (Last 24 hours) at 09/25/15 1553 Last data filed at 09/25/15 1316  Gross per 24 hour  Intake             2003 ml  Output             1900 ml  Net              103 ml   Filed Weights   09/25/15 0529  Weight: 98.6 kg (217 lb 4.8 oz)    General appearance: alert, cooperative, appears stated age and no distress Resp: clear to auscultation bilaterally Cardio: regular rate and rhythm, S1, S2 normal, no murmur, click, rub or gallop GI: soft, non-tender; bowel sounds normal; no masses,  no organomegaly Extremities: extremities normal, atraumatic, no cyanosis or edema Neurologic: Awake and alert. Oriented 3. No focal neurological deficits.  Lab Results:  Data Reviewed: I have personally reviewed following labs and imaging studies  CBC:  Recent Labs Lab 09/22/15 1432 09/24/15 0020  WBC 17.2* 11.7*  HGB 14.3 13.8  HCT 42.7 42.2  MCV 92.8 94.2  PLT 274 196    Basic Metabolic Panel:  Recent Labs Lab 09/24/15 0020 09/25/15 1222  NA 137 142  K 3.2* 3.9  CL 105 109  CO2 24 25  GLUCOSE 139* 99  BUN 7 7  CREATININE 0.84 0.84  CALCIUM 8.8* 8.7*  MG  --  1.9    Coagulation Profile:  Recent Labs Lab 09/25/15 1222  INR 1.28    Cardiac Enzymes:  Recent Labs Lab 09/25/15 0352 09/25/15 0747 09/25/15 1222  TROPONINI 0.20* 0.29* 0.31*    CBG:  Recent Labs Lab 09/24/15 2338 09/25/15 0619  GLUCAP 133* 108*    Thyroid Function Tests:  Recent Labs  09/25/15 1222  TSH 2.323    Radiology Studies: No results found.   Medications:  Scheduled: . enoxaparin (LOVENOX) injection  40 mg Subcutaneous Daily  . furosemide  20 mg Intravenous Once  . lisinopril  2.5 mg Oral Daily  . sodium chloride flush  3 mL Intravenous Q12H  .  sodium chloride flush  3 mL Intravenous Q12H  . sodium chloride flush  3 mL Intravenous Q12H   Continuous:  QQV:ZDGLOV chloride, sodium chloride, acetaminophen, oxyCODONE, promethazine **OR** promethazine **OR** promethazine, sodium chloride flush, sodium chloride flush  Assessment/Plan:  Principal Problem:   Syncope Active Problems:   LBBB (left bundle branch block)   Elevated troponin   Congestive dilated cardiomyopathy (HCC)    Syncope thought to be secondary to orthostatic hypotension Patient's orthostatic vital signs show improvement this morning. Continue IV fluids for now. His presentation is probably secondary to recent surgery. Patient also reports previous history of similar episodes, especially with physical activity. However he is noted to have mildly elevated troponin. His EKG shows LBBB. Since outpatient cardiology workup was planned, we proceeded with echocardiogram. Cardiology was also consulted. Echocardiogram shows depressed left ventricular ejection fraction, along with diastolic dysfunction. The right ventricle was also noted to have decreased systolic function. He was also noted to have tricuspid regurgitation. In view of this plan is to proceed with cardiac catheterization. Discussed with Dr. [name] with cardiology.  Acute combined systolic and diastolic congestive heart failure. See above. Management per cardiology.  Status post recent bilateral hernia repair. The incision sites appear to be stable. No evidence for infection. Pain is reasonably well controlled.  DVT Prophylaxis: Lovenox    Code Status: Full code  Family Communication: Discussed with the patient and his wife Disposition Plan: Await cardiac workup.    LOS: 0 days   [facility]  [facility] Pager [phone] 09/25/2015, 3:53 PM  If 7PM-7AM, please contact night-coverage at [email or web address], password [facility]

## 2015-09-25 NOTE — Progress Notes (Signed)
Aspirin 324 mg given  @ 1342

## 2015-09-25 NOTE — H&P (View-Only) (Signed)
CARDIOLOGY CONSULT NOTE   Patient ID: Wayne Bolton MRN: [id number] DOB/AGE: 03/26/1965 50 y.o.  Admit date: 09/24/2015  Primary Physician   No PCP Per Patient Primary Cardiologist   Dr [name] Reason for Consultation   Syncope, elevated troponin Requesting MD: Dr Rito Ehrlich  Wayne Bolton is a 50 y.o. year old male with a history of migraines assoc w/ N&V, ECG >10 yr ago ok.  09/18 had bilateral hernia surgery>>ECG w/ LBBB>>orthostatic syncope>>seen by cards in PACU, hydration given, salt liberalized, outpatient echo and MV planned. He was held overnight and discharged 09/19 in am.  After discharge he did well initially. The next morning, he got up, showered, was ambulating around the house, felt only tired. He rested, and when he got up, he went to the bathroom, but was not straining. He again felt lightheaded and called his wife. He ambulated about 15 feet with assistance but got steadily worse and lost consciousness when he got to the chair. His wife describes some jerking and minimal snoring respirations.  She called EMS and he woke before they got there. He vomited and felt a little better. In the ER, orthostatic VS were positive and he was hydrated. When he ambulated to see if he could be discharged, the same symptoms happened again with decreasing LOC>>jerking and snoring resp.>>complete loss of consciousness.   He was admitted and has been getting IVF since then. His K+ was low this am at 3.2 and is being supplemented. Today, orthostatic VS were mildly abnormal, he reports being a little light-headed. His troponin's were mildly elevated, but he has had no chest pain.  He has had some groin pain from the surgery, has taken the percocet as prescribed. He has also gotten Zofran and Phenergan for nausea.   He has a long history of presyncope in response to things like blood draws, and being overheated, felt vasovagal.   Past Medical History:  Diagnosis Date  . Headache     history migraines none in last few years     Past Surgical History:  Procedure Laterality Date  . INGUINAL HERNIA REPAIR Bilateral 09/22/2015   Procedure: LAPAROSCOPIC BILATERAL INGUINAL HERNIA REPAIR WITH MESH;  Surgeon: Avel Peace, MD;  Location: [facility] OR;  Service: General;  Laterality: Bilateral;  . INSERTION OF MESH Bilateral 09/22/2015   Procedure: INSERTION OF MESH;  Surgeon: Avel Peace, MD;  Location: [facility] OR;  Service: General;  Laterality: Bilateral;  . TONSILLECTOMY      Allergies  Allergen Reactions  . No Known Allergies     I have reviewed the patient's current medications . enoxaparin (LOVENOX) injection  40 mg Subcutaneous Daily  . regadenoson  0.4 mg Intravenous Once  . sodium chloride flush  3 mL Intravenous Q12H   . sodium chloride 125 mL/hr at 09/25/15 0700   oxyCODONE, promethazine **OR** promethazine **OR** promethazine  Prior to Admission medications   Medication Sig Start Date End Date Taking? Authorizing Provider  ibuprofen (ADVIL,MOTRIN) 200 MG tablet Take 400 mg by mouth every 6 (six) hours as needed for mild pain.   Yes Historical Provider, MD  oxyCODONE (OXY IR/ROXICODONE) 5 MG immediate release tablet Take 1-2 tablets (5-10 mg total) by mouth every 4 (four) hours as needed for moderate pain, severe pain or breakthrough pain. 09/22/15  Yes Avel Peace, MD     Social History   Social History  . Marital status: Married    Spouse name: N/A  . Number of children: N/A  .  Years of education: N/A   Occupational History  . Acupuncturist    Social History Main Topics  . Smoking status: Never Smoker  . Smokeless tobacco: Never Used  . Alcohol use Yes     Comment: occasionally  . Drug use: No  . Sexual activity: Not on file   Other Topics Concern  . Not on file   Social History Narrative   Lives with wife in [place], 2 children in college.    Family Status  Relation Status  . Mother Alive  . Father Alive  . Paternal  Grandfather Deceased   Family History  Problem Relation Age of Onset  . Other Paternal Grandfather 54    felt fine, came home from dinner, found dead, no autopsy     ROS:  Full 14 point review of systems complete and found to be negative unless listed above.  Physical Exam: Blood pressure 120/85, pulse 87, temperature 97.3 F (36.3 C), temperature source Oral, resp. rate 20, height 6\' 1"  (1.854 m), weight 217 lb 4.8 oz (98.6 kg), SpO2 98 %.  General: Well developed, well nourished, male in no acute distress Head: Eyes PERRLA, No xanthomas.   Normocephalic and atraumatic, oropharynx without edema or exudate. Dentition: good Lungs: clear bilaterally Heart: HRRR S1 S2, no rub/gallop, S1, S2 no murmur. pulses are 2+ all 4 extrem.   Neck: No carotid bruits. No lymphadenopathy.  JVD not elevated Abdomen: Bowel sounds present, abdomen soft and tender near the incisions without masses noted. Msk:  No spine or cva tenderness. No weakness, no joint deformities or effusions. Extremities: No clubbing or cyanosis. No edema.  Neuro: Alert and oriented X 3. No focal deficits noted. Psych:  Good affect, responds appropriately Skin: No rashes or lesions noted.  Labs:   Lab Results  Component Value Date   WBC 11.7 (H) 09/24/2015   HGB 13.8 09/24/2015   HCT 42.2 09/24/2015   MCV 94.2 09/24/2015   PLT 196 09/24/2015     Recent Labs Lab 09/24/15 0020  NA 137  K 3.2*  CL 105  CO2 24  BUN 7  CREATININE 0.84  CALCIUM 8.8*  GLUCOSE 139*   No results found for: MG  Recent Labs  09/25/15 0352 09/25/15 0747  TROPONINI 0.20* 0.29*   Echo: 09/25/2015 - Left ventricle: The cavity size was normal. Systolic function was   moderately to severely reduced. The estimated ejection fraction   was in the range of 30% to 35%. Diffuse hypokinesis. Features are   consistent with a pseudonormal left ventricular filling pattern,   with concomitant abnormal relaxation and increased filling   pressure  (grade 2 diastolic dysfunction). - Ventricular septum: Septal motion showed abnormal function and   dyssynergy (LBBB). - Aortic valve: There was mild regurgitation. - Right ventricle: The cavity size was mildly dilated. Wall   thickness was normal. Systolic function was mildly reduced. - Right atrium: The atrium was mildly dilated. - Tricuspid valve: There was moderate regurgitation. - Pulmonary arteries: Systolic pressure was mildly increased. PA   peak pressure: 35 mm Hg (S).  ECG:  09/21 SR, LBBB, HR 81  ASSESSMENT AND PLAN:   The patient was seen today by Dr [name], the patient evaluated and the data reviewed.  Principal Problem:   Syncope - hx vasovagal response to stimulus - orthostatic VS initially positive, s/p hydration  Active Problems:   LBBB (left bundle branch block) - chronicity unclear, seen earlier this week    Elevated troponin -  may be secondary to recent surgery - but with syncope and abnormal echo, cath today - The risks and benefits of a cardiac catheterization including, but not limited to, death, stroke, MI, kidney damage and bleeding were discussed with the patient who indicates understanding and agrees to proceed.    SignedLeanna Battles: Barrett, Rhonda, PA-C 09/25/2015 11:47 AM Alvino ChapelBeeper [phone]  Co-Sign MD  Personally seen and examined. Agree with above.  50 year old status post hernia repair with recurrent near syncope/syncope, prodrome of diaphoresis, nausea compatible with vagal reaction, however EKG demonstrates left bundle branch block and echocardiogram on 09/25/15 showed reduced ejection fraction of 30-35%, dyssynchrony secondary to left bundle branch block and mildly dilated right ventricle with the reduced right ventricular function as well as, mild pulmonary hypertension.   - Given newly discovered cardiomyopathy, right and left heart catheterization radial/brachial approach.  - Risks and benefits of procedure including stroke, heart attack, renal  impairment, bleeding discussed. Willing to proceed.  - Continue to monitor on telemetry. No adverse arrhythmias thus far. I do not believe that his syncope is related to ventricular tachycardia given any history surrounding these events. He has been prone to syncope/presyncope for many many years and describes vagal reactions in the past. Pain syndrome in the femoral region also may precipitate increased vagal tone, i.e. similar to that when hold pressure post catheterization in groin eliciting a vagal response. Regardless, we need to exclude the possibility of ischemic cardiomyopathy.  - Low level troponin elevation likely secondary to cardiomyopathy and recent hypotensive episodes. Demand ischemia like.  - Could also consider cardiac MRI in the future given his RV dilatation and further clarify his cardiomyopathy.  Donato SchultzMark Skains, MD

## 2015-09-25 NOTE — Interval H&P Note (Signed)
History and Physical Interval Note:  09/25/2015 1:48 PM  Wayne Bolton  has presented today for cardiac catheterization, with the diagnosis of syncope, abnormal EKG, and cardiomyopathy.  The various methods of treatment have been discussed with the patient and family. After consideration of risks, benefits and other options for treatment, the patient has consented to  Procedure(s): Right/Left Heart Cath and Coronary Angiography (N/A) as a surgical intervention .  The patient's history has been reviewed, patient examined, no change in status, stable for surgery.  I have reviewed the patient's chart and labs.  Questions were answered to the patient's satisfaction.    Cath Lab Visit (complete for each Cath Lab visit)  Clinical Evaluation Leading to the Procedure:   ACS: Yes.    Non-ACS:    Anginal Classification: No Symptoms (post-operative syncope)  Anti-ischemic medical therapy: No Therapy  Non-Invasive Test Results: No non-invasive testing performed (No stress test; echo demonstrates LVEF 30-35%)  Prior CABG: No previous CABG        Justyna Timoney

## 2015-09-26 ENCOUNTER — Other Ambulatory Visit: Payer: Self-pay | Admitting: Internal Medicine

## 2015-09-26 ENCOUNTER — Observation Stay (HOSPITAL_COMMUNITY): Payer: 59

## 2015-09-26 ENCOUNTER — Encounter (HOSPITAL_COMMUNITY): Payer: Self-pay | Admitting: Radiology

## 2015-09-26 DIAGNOSIS — R7989 Other specified abnormal findings of blood chemistry: Secondary | ICD-10-CM | POA: Diagnosis not present

## 2015-09-26 DIAGNOSIS — I824Y3 Acute embolism and thrombosis of unspecified deep veins of proximal lower extremity, bilateral: Secondary | ICD-10-CM | POA: Diagnosis not present

## 2015-09-26 DIAGNOSIS — I429 Cardiomyopathy, unspecified: Secondary | ICD-10-CM | POA: Diagnosis not present

## 2015-09-26 DIAGNOSIS — I447 Left bundle-branch block, unspecified: Secondary | ICD-10-CM | POA: Diagnosis present

## 2015-09-26 DIAGNOSIS — I82403 Acute embolism and thrombosis of unspecified deep veins of lower extremity, bilateral: Secondary | ICD-10-CM | POA: Diagnosis not present

## 2015-09-26 DIAGNOSIS — I2699 Other pulmonary embolism without acute cor pulmonale: Secondary | ICD-10-CM | POA: Diagnosis not present

## 2015-09-26 DIAGNOSIS — R0902 Hypoxemia: Secondary | ICD-10-CM | POA: Diagnosis not present

## 2015-09-26 DIAGNOSIS — I5041 Acute combined systolic (congestive) and diastolic (congestive) heart failure: Secondary | ICD-10-CM | POA: Diagnosis present

## 2015-09-26 DIAGNOSIS — I951 Orthostatic hypotension: Secondary | ICD-10-CM | POA: Diagnosis present

## 2015-09-26 DIAGNOSIS — R609 Edema, unspecified: Secondary | ICD-10-CM | POA: Diagnosis not present

## 2015-09-26 DIAGNOSIS — K4020 Bilateral inguinal hernia, without obstruction or gangrene, not specified as recurrent: Secondary | ICD-10-CM | POA: Diagnosis present

## 2015-09-26 DIAGNOSIS — I272 Other secondary pulmonary hypertension: Secondary | ICD-10-CM | POA: Diagnosis present

## 2015-09-26 DIAGNOSIS — I2510 Atherosclerotic heart disease of native coronary artery without angina pectoris: Secondary | ICD-10-CM | POA: Diagnosis present

## 2015-09-26 DIAGNOSIS — I82431 Acute embolism and thrombosis of right popliteal vein: Secondary | ICD-10-CM | POA: Diagnosis present

## 2015-09-26 DIAGNOSIS — I2692 Saddle embolus of pulmonary artery without acute cor pulmonale: Secondary | ICD-10-CM | POA: Diagnosis not present

## 2015-09-26 DIAGNOSIS — I2609 Other pulmonary embolism with acute cor pulmonale: Secondary | ICD-10-CM | POA: Diagnosis not present

## 2015-09-26 DIAGNOSIS — I420 Dilated cardiomyopathy: Secondary | ICD-10-CM | POA: Diagnosis present

## 2015-09-26 DIAGNOSIS — I2602 Saddle embolus of pulmonary artery with acute cor pulmonale: Secondary | ICD-10-CM | POA: Diagnosis not present

## 2015-09-26 DIAGNOSIS — R55 Syncope and collapse: Secondary | ICD-10-CM | POA: Diagnosis present

## 2015-09-26 DIAGNOSIS — I248 Other forms of acute ischemic heart disease: Secondary | ICD-10-CM | POA: Diagnosis present

## 2015-09-26 LAB — BASIC METABOLIC PANEL
Anion gap: 10 (ref 5–15)
BUN: 9 mg/dL (ref 6–20)
CO2: 25 mmol/L (ref 22–32)
Calcium: 8.7 mg/dL — ABNORMAL LOW (ref 8.9–10.3)
Chloride: 101 mmol/L (ref 101–111)
Creatinine, Ser: 0.96 mg/dL (ref 0.61–1.24)
GFR calc Af Amer: >60 mL/min (ref >60)
GLUCOSE: 93 mg/dL (ref 65–99)
POTASSIUM: 3.8 mmol/L (ref 3.5–5.1)
Sodium: 136 mmol/L (ref 135–145)

## 2015-09-26 LAB — D-DIMER, QUANTITATIVE (NOT AT ARMC): D DIMER QUANT: 11.22 ug{FEU}/mL — AB (ref 0.00–0.50)

## 2015-09-26 LAB — TSH: TSH: 5.725 u[IU]/mL — ABNORMAL HIGH (ref 0.350–4.500)

## 2015-09-26 LAB — POCT I-STAT 3, VENOUS BLOOD GAS (G3P V)
ACID-BASE DEFICIT: 1 mmol/L (ref 0.0–2.0)
Bicarbonate: 23.5 mmol/L (ref 20.0–28.0)
O2 Saturation: 51 %
PH VEN: 7.389 (ref 7.250–7.430)
PO2 VEN: 28 mmHg — AB (ref 32.0–45.0)
TCO2: 25 mmol/L (ref 0–100)
pCO2, Ven: 38.9 mmHg — ABNORMAL LOW (ref 44.0–60.0)

## 2015-09-26 LAB — CBC
HEMATOCRIT: 41 % (ref 39.0–52.0)
Hemoglobin: 13.2 g/dL (ref 13.0–17.0)
MCH: 30.3 pg (ref 26.0–34.0)
MCHC: 32.2 g/dL (ref 30.0–36.0)
MCV: 94.3 fL (ref 78.0–100.0)
PLATELETS: 250 10*3/uL (ref 150–400)
RBC: 4.35 MIL/uL (ref 4.22–5.81)
RDW: 12.7 % (ref 11.5–15.5)
WBC: 10 10*3/uL (ref 4.0–10.5)

## 2015-09-26 LAB — GLUCOSE, CAPILLARY: Glucose-Capillary: 101 mg/dL — ABNORMAL HIGH (ref 65–99)

## 2015-09-26 LAB — CORTISOL-AM, BLOOD: Cortisol - AM: 18.7 ug/dL (ref 6.7–22.6)

## 2015-09-26 LAB — HEPATIC FUNCTION PANEL
ALBUMIN: 3.2 g/dL — AB (ref 3.5–5.0)
ALK PHOS: 42 U/L (ref 38–126)
ALT: 83 U/L — AB (ref 17–63)
AST: 57 U/L — AB (ref 15–41)
BILIRUBIN DIRECT: 0.3 mg/dL (ref 0.1–0.5)
BILIRUBIN TOTAL: 1 mg/dL (ref 0.3–1.2)
Indirect Bilirubin: 0.7 mg/dL (ref 0.3–0.9)
Total Protein: 5.9 g/dL — ABNORMAL LOW (ref 6.5–8.1)

## 2015-09-26 LAB — T4, FREE: FREE T4: 0.91 ng/dL (ref 0.61–1.12)

## 2015-09-26 LAB — BRAIN NATRIURETIC PEPTIDE: B NATRIURETIC PEPTIDE 5: 284.2 pg/mL — AB (ref 0.0–100.0)

## 2015-09-26 MED ORDER — PROMETHAZINE HCL 25 MG/ML IJ SOLN
12.5000 mg | Freq: Once | INTRAMUSCULAR | Status: DC
  Filled 2015-09-26: qty 1

## 2015-09-26 MED ORDER — HEPARIN (PORCINE) IN NACL 100-0.45 UNIT/ML-% IJ SOLN
1850.0000 [IU]/h | INTRAMUSCULAR | Status: DC
  Administered 2015-09-26: 1600 [IU]/h via INTRAVENOUS
  Administered 2015-09-27 – 2015-09-28 (×3): 1850 [IU]/h via INTRAVENOUS
  Filled 2015-09-26 (×8): qty 250

## 2015-09-26 MED ORDER — GADOBENATE DIMEGLUMINE 529 MG/ML IV SOLN
30.0000 mL | Freq: Once | INTRAVENOUS | Status: AC
  Administered 2015-09-26: 25 mL via INTRAVENOUS

## 2015-09-26 MED ORDER — IOPAMIDOL (ISOVUE-370) INJECTION 76%
INTRAVENOUS | Status: AC
  Administered 2015-09-26: 100 mL
  Filled 2015-09-26: qty 100

## 2015-09-26 MED ORDER — HEPARIN BOLUS VIA INFUSION
2000.0000 [IU] | Freq: Once | INTRAVENOUS | Status: AC
  Administered 2015-09-26: 2000 [IU] via INTRAVENOUS
  Filled 2015-09-26: qty 2000

## 2015-09-26 NOTE — Progress Notes (Addendum)
[facility] PROGRESS NOTE  Wayne Bolton:[id number] DOB: 1965/06/26 DOA: 09/24/2015  PCP: No PCP Per Patient  Brief History/Interval Summary: 50 year old Caucasian male with a past medical history significant only for recent bilateral hernia repair, which was was done a few days ago. At that time patient was found to have LBBB on EKG. Plan was for the patient to undergo outpatient cardiac testing. He returned to the emergency department with episodes of syncope. He was hospitalized for further management. He was seen by cardiology for mildly elevated troponin. Echocardiogram revealed depressed ejection fraction of 30-35%. He underwent cardiac catheterization. Cardiac MRI done this morning.  Reason for Visit: Syncope and orthostatic hypotension  Consultants: Cardiology  Procedures:  Transthoracic echocardiogram Study Conclusions - Left ventricle: The cavity size was normal. There was mild concentric hypertrophy. Systolic function was moderately to severely reduced. The estimated ejection fraction was in the range of 30% to 35%. Diffuse hypokinesis. Features are consistent with a pseudonormal left ventricular filling pattern, with concomitant abnormal relaxation and increased filling pressure (grade 2 diastolic dysfunction). - Ventricular septum: Septal motion showed abnormal function and dyssynergy (LBBB). - Aortic valve: There was mild regurgitation. - Right ventricle: The cavity size was mildly dilated. Wall thickness was normal. Systolic function was mildly reduced. - Right atrium: The atrium was mildly dilated. - Tricuspid valve: There was moderate regurgitation. - Pulmonary arteries: Systolic pressure was mildly increased. PA peak pressure: 35 mm Hg (S).   Antibiotics: None  Subjective/Interval History: Patient feels better this morning. Hasn't had any episodes of nausea or vomiting. Did stand up today for about 30 minutes without any episodes of dizziness. Denies any  chest pain or shortness of breath. Continues to have discomfort in his lower abdomen from his recent surgery.   ROS: Denies any headaches. No leg swelling  Objective:  Vital Signs  Vitals:   09/25/15 1633 09/25/15 2100 09/26/15 0415 09/26/15 1100  BP: (!) 138/92 135/87 134/81 127/89  Pulse: 86 96 82 100  Resp: 16 18 18 18   Temp:  99.7 F (37.6 C) 99.5 F (37.5 C) 99 F (37.2 C)  TempSrc:  Oral Oral Oral  SpO2: 100% 95% 96% 94%  Weight:   95.1 kg (209 lb 11.2 oz)   Height:        Intake/Output Summary (Last 24 hours) at 09/26/15 1304 Last data filed at 09/26/15 1219  Gross per 24 hour  Intake              410 ml  Output             4700 ml  Net            -4290 ml   Filed Weights   09/25/15 0529 09/26/15 0415  Weight: 98.6 kg (217 lb 4.8 oz) 95.1 kg (209 lb 11.2 oz)    General appearance: alert, cooperative, appears stated age and no distress Resp: clear to auscultation bilaterally Cardio: regular rate and rhythm, S1, S2 normal, no murmur, click, rub or gallop GI: soft, non-tender; bowel sounds normal; no masses,  no organomegaly Extremities: No edema Neurologic: Awake and alert. Oriented 3. No focal neurological deficits.  Lab Results:  Data Reviewed: I have personally reviewed following labs and imaging studies  CBC:  Recent Labs Lab 09/22/15 1432 09/24/15 0020 09/26/15 0257  WBC 17.2* 11.7* 10.0  HGB 14.3 13.8 13.2  HCT 42.7 42.2 41.0  MCV 92.8 94.2 94.3  PLT 274 196 250    Basic Metabolic Panel:  Recent Labs Lab 09/24/15 0020 09/25/15 1222 09/26/15 0257  NA 137 142 136  K 3.2* 3.9 3.8  CL 105 109 101  CO2 24 25 25   GLUCOSE 139* 99 93  BUN 7 7 9   CREATININE 0.84 0.84 0.96  CALCIUM 8.8* 8.7* 8.7*  MG  --  1.9  --     Coagulation Profile:  Recent Labs Lab 09/25/15 1222  INR 1.28    Cardiac Enzymes:  Recent Labs Lab 09/25/15 0352 09/25/15 0747 09/25/15 1222  TROPONINI 0.20* 0.29* 0.31*    CBG:  Recent Labs Lab  09/24/15 2338 09/25/15 0619 09/26/15 0549  GLUCAP 133* 108* 101*    Thyroid Function Tests:  Recent Labs  09/26/15 0257  TSH 5.725*    Radiology Studies: No results found.   Medications:  Scheduled: . digoxin  0.125 mg Oral Daily  . enoxaparin (LOVENOX) injection  40 mg Subcutaneous Daily  . furosemide  20 mg Intravenous BID  . losartan  12.5 mg Oral BID  . potassium chloride  40 mEq Oral Daily  . promethazine  12.5-25 mg Intravenous Once  . sodium chloride flush  3 mL Intravenous Q12H  . sodium chloride flush  3 mL Intravenous Q12H  . sodium chloride flush  3 mL Intravenous Q12H  . 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 Patient is being seen by cardiology. He has nonischemic cardiomyopathy. Did not have any coronary artery disease on cardiac catheterization. He has been given diuretics with good response. Cardiac MRI was done this morning and report is pending. Start mobilizing the patient. Cardiac rehabilitation will be consulted. Further management per cardiology. Patient has been started on digoxin and ARB. He is also on spironolactone. TSH was normal at 2.3 yesterday. However this morning it was slightly elevated at 5.7. Will recommend repeating this in the next few weeks. No need for further investigations at this time.  Syncope thought to be secondary to orthostatic hypotension His initial symptoms of orthostatic hypotension and syncope could still have been related to his surgery and medications and pain. However, he has been found to have  significant cardiac abnormalities which are also contributing. Mobilize as tolerated. Cortisol is 18.7.  Status post recent bilateral hernia repair. The incision sites appear to be stable. No evidence for infection. Pain is reasonably well controlled.  DVT Prophylaxis: Lovenox    Code Status: Full code  Family Communication: Discussed with the patient and his wife Disposition Plan: Mobilize. Await further cardiology input and determination of discharge plan.    LOS: 0 days   [facility]oriaKRISHNAN,Silva Aamodt  [facility] Pager [phone] 09/26/2015, 1:04 PM  If 7PM-7AM, please contact night-coverage at [email or web address], password [email or web address]

## 2015-09-26 NOTE — Progress Notes (Signed)
  CT chest reviewed personally has bilateral PE with heavy clot burden and RV strain and probable residual clot burden in RLE.  I feel he would benefit from catheter-directed t-PA. Given recent surgery would favor low dose protocol.   I discussed with Dr. Loreta AveWagner in IR who agrees and will see patient in consultation.   Ok to place on my service.   D/w Dr. Anne FuSkains and we discussed with family in person.   Wayne Macphee,MD 5:04 PM

## 2015-09-26 NOTE — Progress Notes (Signed)
Patient Name: Glendora ScoreMark A Sica Date of Encounter: 09/26/2015  [facility]  H[facility] Problem List     Principal Problem:   Syncope Active Problems:   LBBB (left bundle branch block)   Elevated troponin   Congestive dilated cardiomyopathy (HCC)     Subjective   Decreased energy. No syncope. No CP. Mild SOB. Electrical eng  Inpatient Medications    Scheduled Meds: . digoxin  0.125 mg Oral Daily  . enoxaparin (LOVENOX) injection  40 mg Subcutaneous Daily  . furosemide  20 mg Intravenous BID  . losartan  12.5 mg Oral BID  . potassium chloride  40 mEq Oral Daily  . promethazine  12.5-25 mg Intravenous Once  . sodium chloride flush  3 mL Intravenous Q12H  . sodium chloride flush  3 mL Intravenous Q12H  . sodium chloride flush  3 mL Intravenous Q12H  . spironolactone  12.5 mg Oral Daily   Continuous Infusions:  PRN Meds:.sodium chloride, sodium chloride, acetaminophen, oxyCODONE, promethazine **OR** promethazine **OR** promethazine, sodium chloride flush, sodium chloride flush   Vital Signs    Vitals:   09/25/15 1600 09/25/15 1633 09/25/15 2100 09/26/15 0415  BP: (!) 141/86 (!) 138/92 135/87 134/81  Pulse: 88 86 96 82  Resp: 16 16 18 18   Temp:   99.7 F (37.6 C) 99.5 F (37.5 C)  TempSrc:   Oral Oral  SpO2: 96% 100% 95% 96%  Weight:    209 lb 11.2 oz (95.1 kg)  Height:        Intake/Output Summary (Last 24 hours) at 09/26/15 1203 Last data filed at 09/26/15 1140  Gross per 24 hour  Intake              410 ml  Output             4400 ml  Net            -3990 ml   Filed Weights   09/25/15 0529 09/26/15 0415  Weight: 217 lb 4.8 oz (98.6 kg) 209 lb 11.2 oz (95.1 kg)    Physical Exam    GEN: Well nourished, well developed, in no acute distress.  HEENT: Grossly normal.  Neck: Supple, no obvious JVD, carotid bruits, or masses. Cardiac: Reg mild tachy, 2/6 S murmur, no rubs, or gallops. No clubbing, cyanosis, edema.  Radials/DP/PT 2+ and equal  bilaterally.  Respiratory:  Respirations regular and unlabored, clear to auscultation bilaterally. GI: Soft, nontender, nondistended, BS + x 4. MS: no deformity or atrophy. Skin: warm and dry, no rash. Neuro:  Strength and sensation are intact. Psych: AAOx3.  Normal affect.  Labs    CBC  Recent Labs  09/24/15 0020 09/26/15 0257  WBC 11.7* 10.0  HGB 13.8 13.2  HCT 42.2 41.0  MCV 94.2 94.3  PLT 196 250   Basic Metabolic Panel  Recent Labs  09/25/15 1222 09/26/15 0257  NA 142 136  K 3.9 3.8  CL 109 101  CO2 25 25  GLUCOSE 99 93  BUN 7 9  CREATININE 0.84 0.96  CALCIUM 8.7* 8.7*  MG 1.9  --    Liver Function Tests  Recent Labs  09/26/15 0257  AST 57*  ALT 83*  ALKPHOS 42  BILITOT 1.0  PROT 5.9*  ALBUMIN 3.2*   No results for input(s): LIPASE, AMYLASE in the last 72 hours. Cardiac Enzymes  Recent Labs  09/25/15 0352 09/25/15 0747 09/25/15 1222  TROPONINI 0.20* 0.29* 0.31*   BNP Invalid input(s): POCBNP  D-Dimer No results for input(s): DDIMER in the last 72 hours. Hemoglobin A1C No results for input(s): HGBA1C in the last 72 hours. Fasting Lipid Panel No results for input(s): CHOL, HDL, LDLCALC, TRIG, CHOLHDL, LDLDIRECT in the last 72 hours. Thyroid Function Tests  Recent Labs  09/26/15 0257  TSH 5.725*    Telemetry    No adverse rhythms - Personally Reviewed  ECG     LBBB- Personally Reviewed  Radiology    No results found.   Cardiac Studies   Echo: 09/25/2015 - Left ventricle: The cavity size was normal. Systolic function was moderately to severely reduced. The estimated ejection fraction was in the range of 30% to 35%. Diffuse hypokinesis. Features are consistent with a pseudonormal left ventricular filling pattern, with concomitant abnormal relaxation and increased filling pressure (grade 2 diastolic dysfunction). - Ventricular septum: Septal motion showed abnormal function and dyssynergy (LBBB). - Aortic  valve: There was mild regurgitation. - Right ventricle: The cavity size was mildly dilated. Wall thickness was normal. Systolic function was mildly reduced. - Right atrium: The atrium was mildly dilated. - Tricuspid valve: There was moderate regurgitation. - Pulmonary arteries: Systolic pressure was mildly increased. PA peak pressure: 35 mm Hg (S).  Cath as above  Patient Profile     50 year old status post hernia repair with recurrent near syncope/syncope, prodrome of diaphoresis, nausea compatible with vagal reaction, however EKG demonstrates left bundle branch block and echocardiogram on 09/25/15 showed reduced ejection fraction of 30-35%, dyssynchrony secondary to left bundle branch block and mildly dilated right ventricle with reduced right ventricular function as well as, mild pulmonary hypertension. Bi-V failure, non ischemic cardiomyopathy  Assessment & Plan    Non ischemic cardiomyopathy  - Advanced HF team consult appreciated - will assist with further med changes.   - RV failure as well - likely leading to enhanced presyncope (also has vagal predisposition)  - meds as above  - good diuresis over night - total -4 liters now  - MRI done await read  Post op hernia repair  - stable  Discussed with family  Signed, Donato Schultz, MD  09/26/2015, 12:03 PM

## 2015-09-26 NOTE — Progress Notes (Signed)
CARDIAC REHAB PHASE I   PRE:  Rate/Rhythm: 98 SR  BP:  Supine: 129/86  Sitting: 107/79  Standing: 96/63   SaO2: 94%RA  MODE:  Ambulation: few steps to chair ft   POST:  Rate/Rhythm: 107 ST  BP:  Supine:   Sitting: 96/64  Standing:    SaO2: 93%RA 1440-1535 Reviewed signs/symptoms of CHF and gave CHF packet. Reviewed zones and discussed daily weights, when to call MD, fluid restriction and sodium restriction of 2000 mg. Gave low sodium handouts. Was going to attempt ambulation and follow with recliner but pt orthostatic and feeling a little lightheaded. Encouraged pt to sit in recliner as he had not been sitting in chair and hopefully will help with position change and lightheadedness. To call RN when he needs to get up.   Luetta Nuttingharlene Roshunda Keir, RN BSN  09/26/2015 3:32 PM

## 2015-09-26 NOTE — Progress Notes (Addendum)
ANTICOAGULATION CONSULT NOTE - Initial Consult  Pharmacy Consult for heparin Indication: R/o PE  Assessment: 50 year old Caucasian male with a past medical history significant only for recent bilateral hernia repair, which was was done a few days ago.   Cardiac MRI concerning for pulmonary embolus. Stat CTA ordered.  Goal of Therapy:  Heparin level 0.3-0.7 units/ml Monitor platelets by anticoagulation protocol: Yes   Plan:  Give 2000 units bolus x 1 Start heparin infusion at 1600 units/hr Check anti-Xa level in 6 hours and daily while on heparin Continue to monitor H&H and platelets D/c lovenox (given ~11am)   Allergies  Allergen Reactions  . No Known Allergies     Patient Measurements: Height: 6\' 1"  (185.4 cm) Weight: 209 lb 11.2 oz (95.1 kg) (scale a) IBW/kg (Calculated) : 79.9 Heparin Dosing Weight: 98kg  Vital Signs: Temp: 99 F (37.2 C) (09/22 1100) Temp Source: Oral (09/22 1100) BP: 127/89 (09/22 1100) Pulse Rate: 100 (09/22 1100)  Labs:  Recent Labs  09/24/15 0020 09/25/15 0352 09/25/15 0747 09/25/15 1222 09/26/15 0257  HGB 13.8  --   --   --  13.2  HCT 42.2  --   --   --  41.0  PLT 196  --   --   --  250  LABPROT  --   --   --  16.1*  --   INR  --   --   --  1.28  --   CREATININE 0.84  --   --  0.84 0.96  TROPONINI  --  0.20* 0.29* 0.31*  --     Estimated Creatinine Clearance: 102.9 mL/min (by C-G formula based on SCr of 0.96 mg/dL).   Medical History: Past Medical History:  Diagnosis Date  . Headache    history migraines none in last few years    Sheppard CoilFrank Wilson PharmD., BCPS Clinical Pharmacist Pager [phone] 09/26/2015 3:42 PM

## 2015-09-26 NOTE — Progress Notes (Signed)
Chief Complaint: Patient was seen in consultation today for PE lysis at the request of Dr. Nicholes Mango  Referring Physician(s): Dr. Nicholes Mango  Supervising Physician: Gilmer Mor  Patient Status: Inpatient  History of Present Illness: Wayne Bolton is a 50 y.o. male admitted after syncopal episode. Found to have bilat PE with heavy clot burden and right heart strain. IR is asked to eval for PE lysis. Chart, PMHx, meds, labs, imaging reviewed. He does report some right calf tightness, LE duplex pending Had Lap (B)Ing hernia repair earlier this week without immediate complication.   Past Medical History:  Diagnosis Date  . Headache    history migraines none in last few years    Past Surgical History:  Procedure Laterality Date  . INGUINAL HERNIA REPAIR Bilateral 09/22/2015   Procedure: LAPAROSCOPIC BILATERAL INGUINAL HERNIA REPAIR WITH MESH;  Surgeon: Avel Peace, MD;  Location: [facility] OR;  Service: General;  Laterality: Bilateral;  . INSERTION OF MESH Bilateral 09/22/2015   Procedure: INSERTION OF MESH;  Surgeon: Avel Peace, MD;  Location: [facility];  Service: General;  Laterality: Bilateral;  . TONSILLECTOMY      Allergies: No known allergies  Medications:  Current Facility-Administered Medications:  .  0.9 %  sodium chloride infusion, 250 mL, Intravenous, PRN, Christopher End, MD .  0.9 %  sodium chloride infusion, 250 mL, Intravenous, PRN, Yvonne Kendall, MD, Last Rate: 10 mL/hr at 09/25/15 1548, 250 mL at 09/25/15 1548 .  acetaminophen (TYLENOL) tablet 650 mg, 650 mg, Oral, Q4H PRN, Yvonne Kendall, MD .  furosemide (LASIX) injection 20 mg, 20 mg, Intravenous, BID, Jake Bathe, MD, 20 mg at 09/26/15 1042 .  heparin ADULT infusion 100 units/mL (25000 units/251mL sodium chloride 0.45%), 1,600 Units/hr, Intravenous, Continuous, Earnie Larsson, RPH .  heparin bolus via infusion 2,000 Units, 2,000 Units, Intravenous, Once, Earnie Larsson, [facility] .  losartan  (COZAAR) tablet 12.5 mg, 12.5 mg, Oral, BID, Jake Bathe, MD, 12.5 mg at 09/26/15 1048 .  oxyCODONE (Oxy IR/ROXICODONE) immediate release tablet 5-10 mg, 5-10 mg, Oral, Q4H PRN, Hillary Bow, DO, 5 mg at 09/25/15 0411 .  potassium chloride SA (K-DUR,KLOR-CON) CR tablet 40 mEq, 40 mEq, Oral, Daily, Jake Bathe, MD, 40 mEq at 09/26/15 1042 .  promethazine (PHENERGAN) tablet 25 mg, 25 mg, Oral, Q6H PRN **OR** promethazine (PHENERGAN) injection 12.5-25 mg, 12.5-25 mg, Intravenous, Q6H PRN, 25 mg at 09/25/15 0407 **OR** promethazine (PHENERGAN) suppository 25 mg, 25 mg, Rectal, Q6H PRN, Hillary Bow, DO .  promethazine (PHENERGAN) injection 12.5-25 mg, 12.5-25 mg, Intravenous, Once, Rhonda G Barrett, PA-C .  sodium chloride flush (NS) 0.9 % injection 3 mL, 3 mL, Intravenous, Q12H, Hillary Bow, DO, 3 mL at 09/25/15 2217 .  sodium chloride flush (NS) 0.9 % injection 3 mL, 3 mL, Intravenous, Q12H, Christopher End, MD, 3 mL at 09/26/15 1000 .  sodium chloride flush (NS) 0.9 % injection 3 mL, 3 mL, Intravenous, PRN, Christopher End, MD .  sodium chloride flush (NS) 0.9 % injection 3 mL, 3 mL, Intravenous, Q12H, Christopher End, MD, 3 mL at 09/26/15 1045 .  sodium chloride flush (NS) 0.9 % injection 3 mL, 3 mL, Intravenous, PRN, Yvonne Kendall, MD    Family History  Problem Relation Age of Onset  . Other Paternal Grandfather 78    felt fine, came home from dinner, found dead, no autopsy    Social History   Social History  . Marital status: Married  Spouse name: N/A  . Number of children: N/A  . Years of education: N/A   Occupational History  . Acupuncturist    Social History Main Topics  . Smoking status: Never Smoker  . Smokeless tobacco: Never Used  . Alcohol use Yes     Comment: occasionally  . Drug use: No  . Sexual activity: Not Asked   Other Topics Concern  . None   Social History Narrative   Lives with wife in [place], 2 children in college.      Review of Systems: A 12 point ROS discussed and pertinent positives are indicated in the HPI above.  All other systems are negative.  Review of Systems  Vital Signs: BP 127/89 (BP Location: Left Arm)   Pulse 100   Temp 99 F (37.2 C) (Oral)   Resp 18   Ht 6\' 1"  (1.854 m)   Wt 209 lb 11.2 oz (95.1 kg) Comment: scale a  SpO2 94%   BMI 27.67 kg/m   Physical Exam  Constitutional: He is oriented to person, place, and time. He appears well-developed and well-nourished. No distress.  HENT:  Head: Normocephalic.  Mouth/Throat: Oropharynx is clear and moist.  Neck: Normal range of motion. No JVD present. No tracheal deviation present.  Cardiovascular: Regular rhythm and normal heart sounds.   No murmur heard. Pulmonary/Chest: Effort normal and breath sounds normal. No respiratory distress.  Neurological: He is alert and oriented to person, place, and time.  Skin: Skin is warm.  Psychiatric: He has a normal mood and affect. Judgment normal.    Mallampati Score:  MD Evaluation Airway: WNL Heart: WNL Abdomen: WNL Chest/ Lungs: WNL ASA  Classification: 3 Mallampati/Airway Score: Two  Imaging: Ct Angio Chest Pe W Or Wo Contrast  Result Date: 09/26/2015 CLINICAL DATA:  Shortness of breath.  Four days postoperative EXAM: CT ANGIOGRAPHY CHEST WITH CONTRAST TECHNIQUE: Multidetector CT imaging of the chest was performed using the standard protocol during bolus administration of intravenous contrast. Multiplanar CT image reconstructions and MIPs were obtained to evaluate the vascular anatomy. CONTRAST:  100 mL Isovue 370 nonionic COMPARISON:  None. FINDINGS: Cardiovascular: There is extensive pulmonary embolus bilaterally. Pulmonary emboli arise from the distal main pulmonary artery on the left extending into multiple upper and lower lobe branches. Pulmonary embolus on the right arises at the origin of the right intralobar pulmonary artery extending throughout the right lower lobe  branches. There is right heart strain, evidenced by a right ventricle to left ventricle diameter ratio of 1.2, normal less than 0.9. There is prominence of the ascending thoracic aorta with a maximum transverse diameter of 4.0 x 3.9 cm. There is no thoracic aortic dissection. The visualized great vessels appear unremarkable. Pericardium is not appreciably thickened. Mediastinum/Nodes: Thyroid appears normal. There is no appreciable thoracic adenopathy. Lungs/Pleura: There is mild scarring in the lung apices. There is no parenchymal lung edema or consolidation. There is mild bibasilar atelectasis, however. Upper Abdomen: There is hepatic steatosis. Visualized upper abdominal structures otherwise appear unremarkable. Musculoskeletal: There are no blastic or lytic bone lesions. Review of the MIP images confirms the above findings. IMPRESSION: Positive for acute PE with CT evidence of right heart strain (RV/LV Ratio = 1.2) consistent with at least submassive (intermediate risk) PE. The presence of right heart strain has been associated with an increased risk of morbidity and mortality. Please activate Code PE by paging [phone]. Prominence of the ascending thoracic aorta with a measured transverse diameter 4.0 x 3.9 cm.  Recommend annual imaging followup by CTA or MRA. This recommendation follows 2010 ACCF/AHA/AATS/ACR/ASA/SCA/SCAI/SIR/STS/SVM Guidelines for the Diagnosis and Management of Patients with Thoracic Aortic Disease. Circulation. 2010; 121: Z610-R604 Mild bibasilar atelectasis. No edema or consolidation. No evident adenopathy. There is hepatic steatosis. Critical Value/emergent results were called by telephone at the time of interpretation on 09/26/2015 at 4:38 pm to Dr. Rito Ehrlich, covering hospitalist, who verbally acknowledged these results. Electronically Signed   By: Bretta Bang III M.D.   On: 09/26/2015 16:39   Mr Card Morphology Wo/w Cm  Result Date: 09/26/2015 CLINICAL DATA:  Cardiomyopathy  EXAM: CARDIAC MRI TECHNIQUE: The patient was scanned on a 1.5 Tesla GE magnet. A dedicated cardiac coil was used. Functional imaging was done using Fiesta sequences. 2,3, and 4 chamber views were done to assess for RWMA's. Modified Simpson's rule using a short axis stack was used to calculate an ejection fraction on a dedicated work [employer]. The patient received 28 cc of Multihance. After 10 minutes inversion recovery sequences were used to assess for infiltration and scar tissue. CONTRAST:  28 cc Multihance FINDINGS: The atria where of normal size. There was no apparent ASD/VSD or PFO. The LV had abnormal septal motion likely from LBBB. The quantitative LV EF was 54% (ESV 135 EDV 62 cc SV 73 cc) The RV was severely dilated and severely hypokinetic. Basal diameter 65 mm Mid diameter 48 mm Long Axis:  100 mm The quantitative RV EF was only 13% (EDV 194 cc ESV 170 cc SV 24 cc) IIR and IIIR with fat sat images showed no evidence of RV dysplasia Delayed enhancement images with gadolinium showed no LV myocardial infiltration scar or infarct IMPRESSION: 1) Severe RV enlargement and hypokinesis EF 13% 2) Normal LV size and function abnormal septal motion EF 54% 3) No evidence of RV dysplasia 4) No delayed enhancement scar or infiltration of LV myocardium 5) No obvious ASD/PFO or VSD Findings discussed with Dr Beckie Busing Electronically Signed   By: Charlton Haws M.D.   On: 09/26/2015 13:38    Labs:  CBC:  Recent Labs  09/16/15 0854 09/22/15 1432 09/24/15 0020 09/26/15 0257  WBC 6.8 17.2* 11.7* 10.0  HGB 15.8 14.3 13.8 13.2  HCT 47.3 42.7 42.2 41.0  PLT 320 274 196 250    COAGS:  Recent Labs  09/25/15 1222  INR 1.28    BMP:  Recent Labs  09/24/15 0020 09/25/15 1222 09/26/15 0257  NA 137 142 136  K 3.2* 3.9 3.8  CL 105 109 101  CO2 24 25 25   GLUCOSE 139* 99 93  BUN 7 7 9   CALCIUM 8.8* 8.7* 8.7*  CREATININE 0.84 0.84 0.96  GFRNONAA >60 >60 >60  GFRAA  >60 >60 >60    LIVER FUNCTION TESTS:  Recent Labs  09/26/15 0257  BILITOT 1.0  AST 57*  ALT 83*  ALKPHOS 42  PROT 5.9*  ALBUMIN 3.2*    TUMOR MARKERS: No results for input(s): AFPTM, CEA, CA199, CHROMGRNA in the last 8760 hours.  Assessment and Plan: Acute submassive PE with right heart strain. After evaluation and discussion, feel pt would be a good candidate for PE thrombolysis with EKOS system. Recent surgical hx puts him at slightly increase risk of bleeding, but feel that benefit to his heart outweighs this risk. Discussed PE lysis procedure including indications and goals of therapy with pt and wife. Could offer low dose protocol in this situation. Pt is agreeable to proceed. Risks and Benefits discussed  with the patient including, but not limited to severe bleeding, infection, vascular injury or contrast induced renal failure. All of the patient's questions were answered, patient is agreeable to proceed. Consent signed and in chart.   Thank you for this interesting consult.  I greatly enjoyed meeting Wayne Bolton and look forward to participating in their care.  A copy of this report was sent to the requesting provider on this date.  Electronically Signed: Brayton ElBRUNING, Syre Knerr 09/26/2015, 5:14 PM   I spent a total of 25 minutes in face to face in clinical consultation, greater than 50% of which was counseling/coordinating care for PE lysis

## 2015-09-26 NOTE — Progress Notes (Signed)
1700 Sen by dr.skains and Dr.Bensimhon Discussed ct angio and MRI myoview  Results.. Discussed of plan of care. Dr. Gala RomneyBensimhon spoken with IR . Ok to start heparin infusion. IR NP seen the pt.

## 2015-09-26 NOTE — Progress Notes (Signed)
Patient seen and evaluated. Awaiting catheter directed tPA treatment.  Agree with treatment plan given clinical picture. Risk is significant given recent surgery, but nature of surgery places is low to moderate bleeding risk in compressible sites.  Will re-evaluate post-procedurally.  Jamie KatoAaron Arien Benincasa, MD Pulmonary & Critical Care Medicine September 26, 2015, 8:15 PM

## 2015-09-26 NOTE — Progress Notes (Signed)
Advanced Heart Failure Rounding Note  PCP: None Primary Cardiologist: Dr. [name]  Subjective:    Admitted 09/25/15 after syncopal episode. Was recently admitted for Inguinal hernia repair on 9/18, had syncope after surgery. LBBB was noted, felt to be vaso-vagal. Sent home with plans for Echo and Stress as outpatient.  Pt had recurrence and thus re-presented to ED.  Had + Orthostatics.     Echo positive for LV dysfunction + mild RV dysfunction. L/RHC with non obstructive CAD, elevated filling pressures and depressed cardiac output. HF team consulted to further evaluate LV/RV dysfunction.  Had cMRI this am. Results pending.  States he has been his USOH leading up to his hernia repair.  Fairly active, hikes without DOE.  No orthopnea or bendopnea. Denies history of drug use or tobacco abuse.  No hx of blood transfusions, tattoos, piercings, IV drug use, or time overseas. Drinks alcohol only on special occasions ( < 1 drink a week).   No immediate family history of cardiac disease.  He states his dad is on blood thinners and has had similar blacking out episodes, but is unsure of why.  One paternal uncle who had several "heart attacks" (>80 yo) and another paternal uncle who passed away in his 66s from "Lupus". No personal history of diabetes or thyroid dysfunction.   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   States "I get pretty sick if I even have a small hamburger".  Echo 09/25/15 LVEF 30-35%, GRade 2 DD, LBBB with septal motion abnormality, mild AI, RV mildly dilated and reduced, Mild RAE, Mod TR, PA peak pressure 35 mm Hg.  R/LHC 09/25/15 Fick CO 3.85 Fick CI 1.73 RA mean 20 RV EDP 24 PA 56/27 (37) PW 32/24 (26)  Also showed minimal, non-obstructive CAD, with 30% stenosis at the ostium of the first diagonal branch.  Objective:   Weight Range: 95.1 kg (209 lb  11.2 oz) Body mass index is 27.67 kg/m.   Vital Signs:   Temp:  [97.3 F (36.3 C)-99.7 F (37.6 C)] 99.5 F (37.5 C) (09/22 0415) Pulse Rate:  [0-110] 82 (09/22 0415) Resp:  [0-36] 18 (09/22 0415) BP: (120-143)/(81-106) 134/81 (09/22 0415) SpO2:  [0 %-100 %] 96 % (09/22 0415) Weight:  [95.1 kg (209 lb 11.2 oz)] 95.1 kg (209 lb 11.2 oz) (09/22 0415) Last BM Date: 09/22/15  Weight change: Filed Weights   09/25/15 0529 09/26/15 0415  Weight: 98.6 kg (217 lb 4.8 oz) 95.1 kg (209 lb 11.2 oz)    Intake/Output:   Intake/Output Summary (Last 24 hours) at 09/26/15 0819 Last data filed at 09/26/15 0803  Gross per 24 hour  Intake              350 ml  Output             3350 ml  Net            -3000 ml     Physical Exam:  General:  Well appearing. No resp difficulty HEENT: normal Neck: supple. JVP . Carotids 2+ bilat; no bruits. No lymphadenopathy or thyromegaly appreciated. Cor: PMI nondisplaced. Regular rate & rhythm. No rubs, gallops or murmurs. Lungs: CTAB, normal effort Abdomen: soft, NT, ND, no HSM. No bruits or masses. +BS  Extremities: no cyanosis, clubbing, rash, RLE swollen Neuro: alert & orientedx3, cranial nerves grossly intact. moves  all 4 extremities w/o difficulty. Affect pleasant  Telemetry:  Not connected currently.   Labs: CBC  Recent Labs  09/24/15 0020 09/26/15 0257  WBC 11.7* 10.0  HGB 13.8 13.2  HCT 42.2 41.0  MCV 94.2 94.3  PLT 196 250   Basic Metabolic Panel  Recent Labs  09/25/15 1222 09/26/15 0257  NA 142 136  K 3.9 3.8  CL 109 101  CO2 25 25  GLUCOSE 99 93  BUN 7 9  CREATININE 0.84 0.96  CALCIUM 8.7* 8.7*  MG 1.9  --    Liver Function Tests  Recent Labs  09/26/15 0257  AST 57*  ALT 83*  ALKPHOS 42  BILITOT 1.0  PROT 5.9*  ALBUMIN 3.2*   No results for input(s): LIPASE, AMYLASE in the last 72 hours. Cardiac Enzymes  Recent Labs  09/25/15 0352 09/25/15 0747 09/25/15 1222  TROPONINI 0.20* 0.29* 0.31*     BNP: BNP (last 3 results)  Recent Labs  09/26/15 0257  BNP 284.2*    ProBNP (last 3 results) No results for input(s): PROBNP in the last 8760 hours.   D-Dimer No results for input(s): DDIMER in the last 72 hours. Hemoglobin A1C No results for input(s): HGBA1C in the last 72 hours. Fasting Lipid Panel No results for input(s): CHOL, HDL, LDLCALC, TRIG, CHOLHDL, LDLDIRECT in the last 72 hours. Thyroid Function Tests  Recent Labs  09/26/15 0257  TSH 5.725*    Other results:     Imaging/Studies:   No results found.  Latest Echo  Latest Cath   Medications:     Scheduled Medications: . digoxin  0.125 mg Oral Daily  . enoxaparin (LOVENOX) injection  40 mg Subcutaneous Daily  . furosemide  20 mg Intravenous BID  . losartan  12.5 mg Oral BID  . potassium chloride  40 mEq Oral Daily  . promethazine  12.5-25 mg Intravenous Once  . sodium chloride flush  3 mL Intravenous Q12H  . sodium chloride flush  3 mL Intravenous Q12H  . sodium chloride flush  3 mL Intravenous Q12H  . spironolactone  12.5 mg Oral Daily     Infusions:     PRN Medications:  sodium chloride, sodium chloride, acetaminophen, oxyCODONE, promethazine **OR** promethazine **OR** promethazine, sodium chloride flush, sodium chloride flush   Assessment   1. 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 Non-obstructive CAD 3. Syncope  4. LBBB 5. Elevated Troponin  Plan    Awaiting cardiac MRI results. Will further work up NICM based on results.   Has long history of syncope/presyncope previously ascribed to vagal reactions.   TSH mildly elevated at 5.725. Add on Free T3 and Free T4 as well.  Am-Cortisol within normal limits Will order HIV, Hepatitis panel, ANA, RF, and Ferritin for in the am as further work up for NICM.      Elevated troponin likely combination of demand ischemia from cardiomyopathy, recent  hypotension, and s/p recent surgery.  Non-obstructive CAD on cath.   Length of Stay: 0   Graciella Freer PA-C 09/26/2015, 8:19 AM  Advanced Heart Failure Team Pager [phone] (M-F; 7a - 4p)  Please contact [facility] for night-coverage after hours (4p -7a ) and weekends on [email or web address]  cMRI reviewed personally shows LVEF 54% with markedly dilated and hypokinetic RV. On exam he is SOB at rest. SBP ok. RLE swollen.   High suspicion for PE with high clot burden. Will start heparin.  Check  CT chest and LE dopplers.   If clot burden high may need to consider catheter-directed TPA. (Recen inguinal hernia repair on Monday).   Discussed with patient and family as well as Dr. Anne FuSkains.   Tykesha Konicki,MD 4:28 PM

## 2015-09-27 ENCOUNTER — Inpatient Hospital Stay (HOSPITAL_COMMUNITY): Payer: 59

## 2015-09-27 ENCOUNTER — Encounter (HOSPITAL_COMMUNITY): Payer: Self-pay | Admitting: Interventional Radiology

## 2015-09-27 DIAGNOSIS — I2602 Saddle embolus of pulmonary artery with acute cor pulmonale: Principal | ICD-10-CM

## 2015-09-27 DIAGNOSIS — I2609 Other pulmonary embolism with acute cor pulmonale: Secondary | ICD-10-CM

## 2015-09-27 DIAGNOSIS — I429 Cardiomyopathy, unspecified: Secondary | ICD-10-CM

## 2015-09-27 DIAGNOSIS — R55 Syncope and collapse: Secondary | ICD-10-CM

## 2015-09-27 DIAGNOSIS — I2699 Other pulmonary embolism without acute cor pulmonale: Secondary | ICD-10-CM

## 2015-09-27 HISTORY — PX: IR GENERIC HISTORICAL: IMG1180011

## 2015-09-27 LAB — GLUCOSE, CAPILLARY
Glucose-Capillary: 106 mg/dL — ABNORMAL HIGH (ref 65–99)
Glucose-Capillary: 102 mg/dL — ABNORMAL HIGH (ref 65–99)

## 2015-09-27 LAB — HEPARIN LEVEL (UNFRACTIONATED)
HEPARIN UNFRACTIONATED: 0.25 [IU]/mL — AB (ref 0.30–0.70)
HEPARIN UNFRACTIONATED: 0.53 [IU]/mL (ref 0.30–0.70)
Heparin Unfractionated: 0.52 IU/mL (ref 0.30–0.70)
Heparin Unfractionated: 0.53 IU/mL (ref 0.30–0.70)

## 2015-09-27 LAB — CBC WITH DIFFERENTIAL/PLATELET
Basophils Absolute: 0 10*3/uL (ref 0.0–0.1)
Basophils Relative: 0 %
Eosinophils Absolute: 0.1 10*3/uL (ref 0.0–0.7)
Eosinophils Relative: 1 %
HCT: 41.9 % (ref 39.0–52.0)
HEMOGLOBIN: 14 g/dL (ref 13.0–17.0)
LYMPHS ABS: 2.6 10*3/uL (ref 0.7–4.0)
LYMPHS PCT: 22 %
MCH: 30.7 pg (ref 26.0–34.0)
MCHC: 33.4 g/dL (ref 30.0–36.0)
MCV: 91.9 fL (ref 78.0–100.0)
Monocytes Absolute: 1.1 10*3/uL — ABNORMAL HIGH (ref 0.1–1.0)
Monocytes Relative: 9 %
NEUTROS PCT: 68 %
Neutro Abs: 7.8 10*3/uL — ABNORMAL HIGH (ref 1.7–7.7)
Platelets: 271 10*3/uL (ref 150–400)
RBC: 4.56 MIL/uL (ref 4.22–5.81)
RDW: 12.6 % (ref 11.5–15.5)
WBC: 11.6 10*3/uL — AB (ref 4.0–10.5)

## 2015-09-27 LAB — BASIC METABOLIC PANEL
ANION GAP: 14 (ref 5–15)
BUN: 15 mg/dL (ref 6–20)
CHLORIDE: 100 mmol/L — AB (ref 101–111)
CO2: 23 mmol/L (ref 22–32)
Calcium: 9.3 mg/dL (ref 8.9–10.3)
Creatinine, Ser: 0.89 mg/dL (ref 0.61–1.24)
GFR calc non Af Amer: >60 mL/min (ref >60)
Glucose, Bld: 102 mg/dL — ABNORMAL HIGH (ref 65–99)
Potassium: 3.5 mmol/L (ref 3.5–5.1)
Sodium: 137 mmol/L (ref 135–145)

## 2015-09-27 LAB — CBC
HEMATOCRIT: 42.6 % (ref 39.0–52.0)
HEMATOCRIT: 40.5 % (ref 39.0–52.0)
HEMOGLOBIN: 14.5 g/dL (ref 13.0–17.0)
HEMOGLOBIN: 13.7 g/dL (ref 13.0–17.0)
MCH: 31.3 pg (ref 26.0–34.0)
MCH: 30.8 pg (ref 26.0–34.0)
MCHC: 34 g/dL (ref 30.0–36.0)
MCHC: 33.8 g/dL (ref 30.0–36.0)
MCV: 91.8 fL (ref 78.0–100.0)
MCV: 91 fL (ref 78.0–100.0)
Platelets: 270 10*3/uL (ref 150–400)
Platelets: 255 10*3/uL (ref 150–400)
RBC: 4.64 MIL/uL (ref 4.22–5.81)
RBC: 4.45 MIL/uL (ref 4.22–5.81)
RDW: 12.4 % (ref 11.5–15.5)
RDW: 12.5 % (ref 11.5–15.5)
WBC: 13.3 10*3/uL — AB (ref 4.0–10.5)
WBC: 13.8 10*3/uL — AB (ref 4.0–10.5)

## 2015-09-27 LAB — FIBRINOGEN
Fibrinogen: 601 mg/dL — ABNORMAL HIGH (ref 210–475)
Fibrinogen: 585 mg/dL — ABNORMAL HIGH (ref 210–475)

## 2015-09-27 LAB — HIV ANTIBODY (ROUTINE TESTING W REFLEX): HIV SCREEN 4TH GENERATION: NONREACTIVE

## 2015-09-27 LAB — T3, FREE: T3 FREE: 2.4 pg/mL (ref 2.0–4.4)

## 2015-09-27 LAB — MRSA PCR SCREENING: MRSA by PCR: NEGATIVE

## 2015-09-27 LAB — FERRITIN: Ferritin: 420 ng/mL — ABNORMAL HIGH (ref 24–336)

## 2015-09-27 MED ORDER — HEPARIN BOLUS VIA INFUSION
1600.0000 [IU] | Freq: Once | INTRAVENOUS | Status: AC
  Administered 2015-09-27: 1600 [IU] via INTRAVENOUS
  Filled 2015-09-27: qty 1600

## 2015-09-27 MED ORDER — MIDAZOLAM HCL 2 MG/2ML IJ SOLN
INTRAMUSCULAR | Status: AC
  Filled 2015-09-27: qty 2

## 2015-09-27 MED ORDER — LIDOCAINE HCL 1 % IJ SOLN
INTRAMUSCULAR | Status: AC
  Filled 2015-09-27: qty 20

## 2015-09-27 MED ORDER — SODIUM CHLORIDE 0.9 % IV SOLN
INTRAVENOUS | Status: DC
  Administered 2015-09-28: 20.8 mL/h via INTRAVENOUS

## 2015-09-27 MED ORDER — IOPAMIDOL (ISOVUE-300) INJECTION 61%
INTRAVENOUS | Status: AC
  Filled 2015-09-27: qty 50

## 2015-09-27 MED ORDER — FENTANYL CITRATE (PF) 100 MCG/2ML IJ SOLN
INTRAMUSCULAR | Status: AC
  Filled 2015-09-27: qty 2

## 2015-09-27 MED ORDER — LIDOCAINE HCL 1 % IJ SOLN
INTRAMUSCULAR | Status: AC | PRN
  Administered 2015-09-27: 10 mL

## 2015-09-27 MED ORDER — SODIUM CHLORIDE 0.9 % IV SOLN
INTRAVENOUS | Status: DC
  Administered 2015-09-28: 35 mL/h via INTRAVENOUS

## 2015-09-27 MED ORDER — SODIUM CHLORIDE 0.9 % IV SOLN
6.0000 mg | Freq: Once | INTRAVENOUS | Status: AC
  Administered 2015-09-27: 6 mg via INTRAVENOUS
  Filled 2015-09-27: qty 6

## 2015-09-27 MED ORDER — SODIUM CHLORIDE 0.9% FLUSH
3.0000 mL | Freq: Two times a day (BID) | INTRAVENOUS | Status: DC
  Administered 2015-09-27 – 2015-09-30 (×5): 3 mL via INTRAVENOUS

## 2015-09-27 MED ORDER — SODIUM CHLORIDE 0.9% FLUSH
3.0000 mL | INTRAVENOUS | Status: DC | PRN
Start: 2015-09-27 — End: 2015-09-30

## 2015-09-27 MED ORDER — SODIUM CHLORIDE 0.9 % IV SOLN: INTRAVENOUS | Status: DC

## 2015-09-27 MED ORDER — MIDAZOLAM HCL 2 MG/2ML IJ SOLN
INTRAMUSCULAR | Status: AC | PRN
  Administered 2015-09-27: 1 mg via INTRAVENOUS

## 2015-09-27 MED ORDER — FENTANYL CITRATE (PF) 100 MCG/2ML IJ SOLN
INTRAMUSCULAR | Status: AC | PRN
  Administered 2015-09-27: 50 ug via INTRAVENOUS

## 2015-09-27 MED ORDER — SODIUM CHLORIDE 0.9 % IV SOLN: 250.0000 mL | INTRAVENOUS | Status: DC | PRN

## 2015-09-27 NOTE — Progress Notes (Signed)
ANTICOAGULATION CONSULT NOTE - Follow-up Consult  Pharmacy Consult for heparin Indication: PE  Allergies  Allergen Reactions  . No Known Allergies     Patient Measurements: Height: 6\' 1"  (185.4 cm) Weight: 200 lb 9.6 oz (91 kg) IBW/kg (Calculated) : 79.9 Heparin Dosing Weight: 98kg  Vital Signs: Temp: 98.9 F (37.2 C) (09/23 1953) Temp Source: Oral (09/23 1953) BP: 134/89 (09/23 1900) Pulse Rate: 88 (09/23 1900)  Labs:  Recent Labs  09/25/15 0352 09/25/15 0747 09/25/15 1222  09/26/15 0257  09/27/15 0630 09/27/15 0632 09/27/15 1440 09/27/15 2020  HGB  --   --   --   < > 13.2  --   --  14.0 14.5 13.7  HCT  --   --   --   < > 41.0  --   --  41.9 42.6 40.5  PLT  --   --   --   < > 250  --   --  271 270 255  LABPROT  --   --  16.1*  --   --   --   --   --   --   --   INR  --   --  1.28  --   --   --   --   --   --   --   HEPARINUNFRC  --   --   --   --   --   < > 0.53  --  0.52 0.53  CREATININE  --   --  0.84  --  0.96  --   --  0.89  --   --   TROPONINI 0.20* 0.29* 0.31*  --   --   --   --   --   --   --   < > = values in this interval not displayed.  Estimated Creatinine Clearance: 111 mL/min (by C-G formula based on SCr of 0.89 mg/dL).   Medical History: Past Medical History:  Diagnosis Date  . Headache    history migraines none in last few years   Assessment: [age] year old M on heparin for extensive b/l PE with R heart strain. S/p lysis today and continues on IV heparin. Heparin level remains at goal at 0.53. CBC stable wnl, no bleed documented.  Goal of Therapy:  Heparin level 0.3-0.7 units/ml Monitor platelets by anticoagulation protocol: Yes   Plan:  - Continue heparin 1850 units/hr - Continue Q6H heparin level - Daily Heparin level/CBC - Monitor for s/sx bleeding  Lysle Pearlachel Rumbarger, PharmD, BCPS Pager # [phone] 09/27/2015 9:24 PM

## 2015-09-27 NOTE — Progress Notes (Signed)
Advanced Heart Failure Rounding Note  PCP: None Primary Cardiologist: Dr. Anne FuSkains  Subjective:    Admitted 09/25/15 after syncopal episode. Was recently admitted for Inguinal hernia repair on 9/18, had syncope after surgery. LBBB was noted, felt to be vaso-vagal. Sent home with plans for Echo and Stress as outpatient.  Pt had recurrence and thus re-presented to ED.  Had + Orthostatics.     Echo positive for LV dysfunction + mild RV dysfunction. L/RHC with non obstructive CAD, elevated filling pressures and depressed cardiac output. HF team consulted to further evaluate LV/RV dysfunction.  Echo 09/25/15 LVEF 30-35%, GRade 2 DD, LBBB with septal motion abnormality, mild AI, RV mildly dilated and reduced, Mild RAE, Mod TR, PA peak pressure 35 mm Hg.  R/LHC 09/25/15 Fick CO 3.85 Fick CI 1.73 RA mean 20 RV EDP 24 PA 56/27 (37) PW 32/24 (26)  Also showed minimal, non-obstructive CAD, with 30% stenosis at the ostium of the first diagonal branch.  cMRI LVEF 54% severe RV dysfunction  Chest CT 09/26/15: submassive bilateral PE with RV strain  Feels ok this am. On heparin. Denies SOB. RLE still sore  Objective:   Weight Range: 91 kg (200 lb 9.6 oz) Body mass index is 26.47 kg/m.   Vital Signs:   Temp:  [98.4 F (36.9 C)-99 F (37.2 C)] 98.7 F (37.1 C) (09/23 0426) Pulse Rate:  [80-100] 80 (09/23 0426) Resp:  [18] 18 (09/23 0426) BP: (127-139)/(74-89) 127/82 (09/23 0426) SpO2:  [94 %-98 %] 98 % (09/23 0426) Weight:  [91 kg (200 lb 9.6 oz)] 91 kg (200 lb 9.6 oz) (09/23 0428) Last BM Date: 09/22/15  Weight change: Filed Weights   09/25/15 0529 09/26/15 0415 09/27/15 0428  Weight: 98.6 kg (217 lb 4.8 oz) 95.1 kg (209 lb 11.2 oz) 91 kg (200 lb 9.6 oz)    Intake/Output:   Intake/Output Summary (Last 24 hours) at 09/27/15 1026 Last data filed at 09/27/15 1000  Gross per 24 hour  Intake          1192.35 ml  Output             3675 ml  Net         -2482.65 ml     Physical  Exam:  General:  Well appearing. No resp difficulty HEENT: normal Neck: supple. JVP 8-9 . Carotids 2+ bilat; no bruits. No lymphadenopathy or thyromegaly appreciated. Cor: PMI nondisplaced. Regular rate & rhythm. No rubs, gallops or murmurs. Lungs: CTAB, normal effort Abdomen: soft, NT, ND, no HSM. No bruits or masses. +BS  Extremities: no cyanosis, clubbing, rash, RLE swollen Neuro: alert & orientedx3, cranial nerves grossly intact. moves all 4 extremities w/o difficulty. Affect pleasant  Telemetry:  Not connected currently.   Labs: CBC  Recent Labs  09/26/15 0257 09/27/15 0632  WBC 10.0 11.6*  NEUTROABS  --  7.8*  HGB 13.2 14.0  HCT 41.0 41.9  MCV 94.3 91.9  PLT 250 271   Basic Metabolic Panel  Recent Labs  09/25/15 1222 09/26/15 0257 09/27/15 0632  NA 142 136 137  K 3.9 3.8 3.5  CL 109 101 100*  CO2 25 25 23   GLUCOSE 99 93 102*  BUN 7 9 15   CREATININE 0.84 0.96 0.89  CALCIUM 8.7* 8.7* 9.3  MG 1.9  --   --    Liver Function Tests  Recent Labs  09/26/15 0257  AST 57*  ALT 83*  ALKPHOS 42  BILITOT 1.0  PROT 5.9*  ALBUMIN 3.2*   No results for input(s): LIPASE, AMYLASE in the last 72 hours. Cardiac Enzymes  Recent Labs  09/25/15 0352 09/25/15 0747 09/25/15 1222  TROPONINI 0.20* 0.29* 0.31*    BNP: BNP (last 3 results)  Recent Labs  09/26/15 0257  BNP 284.2*    ProBNP (last 3 results) No results for input(s): PROBNP in the last 8760 hours.   D-Dimer  Recent Labs  09/26/15 1550  DDIMER 11.22*   Hemoglobin A1C No results for input(s): HGBA1C in the last 72 hours. Fasting Lipid Panel No results for input(s): CHOL, HDL, LDLCALC, TRIG, CHOLHDL, LDLDIRECT in the last 72 hours. Thyroid Function Tests  Recent Labs  09/26/15 0257 09/26/15 0955  TSH 5.725*  --   T3FREE  --  2.4    Other results:     Imaging/Studies:  Ct Angio Chest Pe W Or Wo Contrast  Result Date: 09/26/2015 CLINICAL DATA:  Shortness of breath.  Four  days postoperative EXAM: CT ANGIOGRAPHY CHEST WITH CONTRAST TECHNIQUE: Multidetector CT imaging of the chest was performed using the standard protocol during bolus administration of intravenous contrast. Multiplanar CT image reconstructions and MIPs were obtained to evaluate the vascular anatomy. CONTRAST:  100 mL Isovue 370 nonionic COMPARISON:  None. FINDINGS: Cardiovascular: There is extensive pulmonary embolus bilaterally. Pulmonary emboli arise from the distal main pulmonary artery on the left extending into multiple upper and lower lobe branches. Pulmonary embolus on the right arises at the origin of the right intralobar pulmonary artery extending throughout the right lower lobe branches. There is right heart strain, evidenced by a right ventricle to left ventricle diameter ratio of 1.2, normal less than 0.9. There is prominence of the ascending thoracic aorta with a maximum transverse diameter of 4.0 x 3.9 cm. There is no thoracic aortic dissection. The visualized great vessels appear unremarkable. Pericardium is not appreciably thickened. Mediastinum/Nodes: Thyroid appears normal. There is no appreciable thoracic adenopathy. Lungs/Pleura: There is mild scarring in the lung apices. There is no parenchymal lung edema or consolidation. There is mild bibasilar atelectasis, however. Upper Abdomen: There is hepatic steatosis. Visualized upper abdominal structures otherwise appear unremarkable. Musculoskeletal: There are no blastic or lytic bone lesions. Review of the MIP images confirms the above findings. IMPRESSION: Positive for acute PE with CT evidence of right heart strain (RV/LV Ratio = 1.2) consistent with at least submassive (intermediate risk) PE. The presence of right heart strain has been associated with an increased risk of morbidity and mortality. Please activate Code PE by paging [phone]. Prominence of the ascending thoracic aorta with a measured transverse diameter 4.0 x 3.9 cm. Recommend  annual imaging followup by CTA or MRA. This recommendation follows 2010 ACCF/AHA/AATS/ACR/ASA/SCA/SCAI/SIR/STS/SVM Guidelines for the Diagnosis and Management of Patients with Thoracic Aortic Disease. Circulation. 2010; 121: U981-X914 Mild bibasilar atelectasis. No edema or consolidation. No evident adenopathy. There is hepatic steatosis. Critical Value/emergent results were called by telephone at the time of interpretation on 09/26/2015 at 4:38 pm to Dr. Rito Ehrlich, covering hospitalist, who verbally acknowledged these results. Electronically Signed   By: Bretta Bang III M.D.   On: 09/26/2015 16:39   Mr Card Morphology Wo/w Cm  Result Date: 09/26/2015 CLINICAL DATA:  Cardiomyopathy EXAM: CARDIAC MRI TECHNIQUE: The patient was scanned on a 1.5 Tesla GE magnet. A dedicated cardiac coil was used. Functional imaging was done using Fiesta sequences. 2,3, and 4 chamber views were done to assess for RWMA's. Modified Simpson's rule using a short axis stack was used to calculate an  ejection fraction on a dedicated work [employer]. The patient received 28 cc of Multihance. After 10 minutes inversion recovery sequences were used to assess for infiltration and scar tissue. CONTRAST:  28 cc Multihance FINDINGS: The atria where of normal size. There was no apparent ASD/VSD or PFO. The LV had abnormal septal motion likely from LBBB. The quantitative LV EF was 54% (ESV 135 EDV 62 cc SV 73 cc) The RV was severely dilated and severely hypokinetic. Basal diameter 65 mm Mid diameter 48 mm Long Axis:  100 mm The quantitative RV EF was only 13% (EDV 194 cc ESV 170 cc SV 24 cc) IIR and IIIR with fat sat images showed no evidence of RV dysplasia Delayed enhancement images with gadolinium showed no LV myocardial infiltration scar or infarct IMPRESSION: 1) Severe RV enlargement and hypokinesis EF 13% 2) Normal LV size and function abnormal septal motion EF 54% 3) No evidence of RV dysplasia 4) No delayed  enhancement scar or infiltration of LV myocardium 5) No obvious ASD/PFO or VSD Findings discussed with Dr Beckie Busing Electronically Signed   By: Charlton Haws M.D.   On: 09/26/2015 13:38    Latest Echo  Latest Cath   Medications:     Scheduled Medications: . furosemide  20 mg Intravenous BID  . losartan  12.5 mg Oral BID  . potassium chloride  40 mEq Oral Daily  . promethazine  12.5-25 mg Intravenous Once  . sodium chloride flush  3 mL Intravenous Q12H  . sodium chloride flush  3 mL Intravenous Q12H  . sodium chloride flush  3 mL Intravenous Q12H    Infusions: . heparin 1,850 Units/hr (09/27/15 0626)    PRN Medications: sodium chloride, sodium chloride, acetaminophen, oxyCODONE, promethazine **OR** promethazine **OR** promethazine, sodium chloride flush, sodium chloride flush   Assessment   1. Submassive bilateral PE with RV strain  2. Non-obstructive CAD 3. Syncope  4. LBBB 5. Elevated Troponin  Plan    Stable this am. I spoke with IR and will go for catheter-directed lysis of PE this am. (next case). Continue heparin Aware of risk of bleeding particularly with recent surgery.  Check LE u/s to assess LE clot burden. May need to consider temporary IVC filter  Switch to apixaban after t-pa lysis process complete.   Bensimhon, Daniel,MD 10:26 AM

## 2015-09-27 NOTE — Progress Notes (Signed)
[facility] PROGRESS NOTE  BREN STEERS WUJ:[id number] DOB: 1965/01/25 DOA: 09/24/2015  PCP: No PCP Per Patient  Brief History/Interval Summary: 50 year old Caucasian male with a past medical history significant only for recent bilateral hernia repair, which was was done a few days ago. At that time patient was found to have LBBB on EKG. Plan was for the patient to undergo outpatient cardiac testing. He returned to the emergency department with episodes of syncope. He was hospitalized for further management. He was seen by cardiology for mildly elevated troponin. Echocardiogram revealed depressed ejection fraction of 30-35%. He underwent cardiac catheterization. He was also found to have right ventricular dysfunction. Cardiac MRI was done. All these findings prompted a CT angiogram of his chest where he was found to have a significant pulmonary embolism.  Reason for Visit: Syncope and orthostatic hypotension  Consultants: Cardiology  Procedures:  Transthoracic echocardiogram Study Conclusions - Left ventricle: The cavity size was normal. There was mild concentric hypertrophy. Systolic function was moderately to severely reduced. The estimated ejection fraction was in the range of 30% to 35%. Diffuse hypokinesis. Features are consistent with a pseudonormal left ventricular filling pattern, with concomitant abnormal relaxation and increased filling pressure (grade 2 diastolic dysfunction). - Ventricular septum: Septal motion showed abnormal function and dyssynergy (LBBB). - Aortic valve: There was mild regurgitation. - Right ventricle: The cavity size was mildly dilated. Wall thickness was normal. Systolic function was mildly reduced. - Right atrium: The atrium was mildly dilated. - Tricuspid valve: There was moderate regurgitation. - Pulmonary arteries: Systolic pressure was mildly increased. PA peak pressure: 35 mm Hg (S).   Antibiotics: None  Subjective/Interval  History: Patient denies any complaints this morning. States that the pain in his lower abdomen, over his incision site, is reasonably well controlled. Denies any further nausea or vomiting. Denies chest pain or shortness of breath.  ROS: Denies any headaches. No leg swelling  Objective:  Vital Signs  Vitals:   09/26/15 2010 09/26/15 2201 09/27/15 0426 09/27/15 0428  BP: 139/74 135/89 127/82   Pulse: 94 91 80   Resp: 18  18   Temp: 98.4 F (36.9 C)  98.7 F (37.1 C)   TempSrc: Oral  Oral   SpO2: 95%  98%   Weight:    91 kg (200 lb 9.6 oz)  Height:        Intake/Output Summary (Last 24 hours) at 09/27/15 0906 Last data filed at 09/27/15 0600  Gross per 24 hour  Intake          1252.35 ml  Output             3675 ml  Net         -2422.65 ml   Filed Weights   09/25/15 0529 09/26/15 0415 09/27/15 0428  Weight: 98.6 kg (217 lb 4.8 oz) 95.1 kg (209 lb 11.2 oz) 91 kg (200 lb 9.6 oz)    General appearance: alert, cooperative, appears stated age and no distress Resp: clear to auscultation bilaterally Cardio: regular rate and rhythm, S1, S2 normal, no murmur, click, rub or gallop GI: soft, non-tender; bowel sounds normal; no masses,  no organomegaly Extremities: No edema Neurologic: Awake and alert. Oriented 3. No focal neurological deficits.  Lab Results:  Data Reviewed: I have personally reviewed following labs and imaging studies  CBC:  Recent Labs Lab 09/22/15 1432 09/24/15 0020 09/26/15 0257 09/27/15 0632  WBC 17.2* 11.7* 10.0 11.6*  NEUTROABS  --   --   --  7.8*  HGB 14.3 13.8 13.2 14.0  HCT 42.7 42.2 41.0 41.9  MCV 92.8 94.2 94.3 91.9  PLT 274 196 250 271    Basic Metabolic Panel:  Recent Labs Lab 09/24/15 0020 09/25/15 1222 09/26/15 0257 09/27/15 0632  NA 137 142 136 137  K 3.2* 3.9 3.8 3.5  CL 105 109 101 100*  CO2 24 25 25 23   GLUCOSE 139* 99 93 102*  BUN 7 7 9 15   CREATININE 0.84 0.84 0.96 0.89  CALCIUM 8.8* 8.7* 8.7* 9.3  MG  --  1.9  --    --     Coagulation Profile:  Recent Labs Lab 09/25/15 1222  INR 1.28    Cardiac Enzymes:  Recent Labs Lab 09/25/15 0352 09/25/15 0747 09/25/15 1222  TROPONINI 0.20* 0.29* 0.31*    CBG:  Recent Labs Lab 09/24/15 2338 09/25/15 0619 09/26/15 0549 09/27/15 0536  GLUCAP 133* 108* 101* 106*    Thyroid Function Tests:  Recent Labs  09/26/15 0257 09/26/15 0955  TSH 5.725*  --   FREET4  --  0.91  T3FREE  --  2.4    Radiology Studies: Ct Angio Chest Pe W Or Wo Contrast  Result Date: 09/26/2015 CLINICAL DATA:  Shortness of breath.  Four days postoperative EXAM: CT ANGIOGRAPHY CHEST WITH CONTRAST TECHNIQUE: Multidetector CT imaging of the chest was performed using the standard protocol during bolus administration of intravenous contrast. Multiplanar CT image reconstructions and MIPs were obtained to evaluate the vascular anatomy. CONTRAST:  100 mL Isovue 370 nonionic COMPARISON:  None. FINDINGS: Cardiovascular: There is extensive pulmonary embolus bilaterally. Pulmonary emboli arise from the distal main pulmonary artery on the left extending into multiple upper and lower lobe branches. Pulmonary embolus on the right arises at the origin of the right intralobar pulmonary artery extending throughout the right lower lobe branches. There is right heart strain, evidenced by a right ventricle to left ventricle diameter ratio of 1.2, normal less than 0.9. There is prominence of the ascending thoracic aorta with a maximum transverse diameter of 4.0 x 3.9 cm. There is no thoracic aortic dissection. The visualized great vessels appear unremarkable. Pericardium is not appreciably thickened. Mediastinum/Nodes: Thyroid appears normal. There is no appreciable thoracic adenopathy. Lungs/Pleura: There is mild scarring in the lung apices. There is no parenchymal lung edema or consolidation. There is mild bibasilar atelectasis, however. Upper Abdomen: There is hepatic steatosis. Visualized upper  abdominal structures otherwise appear unremarkable. Musculoskeletal: There are no blastic or lytic bone lesions. Review of the MIP images confirms the above findings. IMPRESSION: Positive for acute PE with CT evidence of right heart strain (RV/LV Ratio = 1.2) consistent with at least submassive (intermediate risk) PE. The presence of right heart strain has been associated with an increased risk of morbidity and mortality. Please activate Code PE by paging [phone]. Prominence of the ascending thoracic aorta with a measured transverse diameter 4.0 x 3.9 cm. Recommend annual imaging followup by CTA or MRA. This recommendation follows 2010 ACCF/AHA/AATS/ACR/ASA/SCA/SCAI/SIR/STS/SVM Guidelines for the Diagnosis and Management of Patients with Thoracic Aortic Disease. Circulation. 2010; 121: [id number] Mild bibasilar atelectasis. No edema or consolidation. No evident adenopathy. There is hepatic steatosis. Critical Value/emergent results were called by telephone at the time of interpretation on 09/26/2015 at 4:38 pm to Dr. Rito EhrlichKrishnan, covering hospitalist, who verbally acknowledged these results. Electronically Signed   By: Bretta BangWilliam  Woodruff III M.D.   On: 09/26/2015 16:39   Mr Card Morphology Wo/w Cm  Result Date: 09/26/2015 CLINICAL DATA:  Cardiomyopathy EXAM: CARDIAC MRI TECHNIQUE: The patient was scanned on a 1.5 Tesla GE magnet. A dedicated cardiac coil was used. Functional imaging was done using Fiesta sequences. 2,3, and 4 chamber views were done to assess for RWMA's. Modified Simpson's rule using a short axis stack was used to calculate an ejection fraction on a dedicated work [employer] officer, [employer]. The patient received 28 cc of Multihance. After 10 minutes inversion recovery sequences were used to assess for infiltration and scar tissue. CONTRAST:  28 cc Multihance FINDINGS: The atria where of normal size. There was no apparent ASD/VSD or PFO. The LV had abnormal septal motion likely from LBBB. The  quantitative LV EF was 54% (ESV 135 EDV 62 cc SV 73 cc) The RV was severely dilated and severely hypokinetic. Basal diameter 65 mm Mid diameter 48 mm Long Axis:  100 mm The quantitative RV EF was only 13% (EDV 194 cc ESV 170 cc SV 24 cc) IIR and IIIR with fat sat images showed no evidence of RV dysplasia Delayed enhancement images with gadolinium showed no LV myocardial infiltration scar or infarct IMPRESSION: 1) Severe RV enlargement and hypokinesis EF 13% 2) Normal LV size and function abnormal septal motion EF 54% 3) No evidence of RV dysplasia 4) No delayed enhancement scar or infiltration of LV myocardium 5) No obvious ASD/PFO or VSD Findings discussed with Dr Beckie Busing Electronically Signed   By: Charlton Haws M.D.   On: 09/26/2015 13:38     Medications:  Scheduled: . furosemide  20 mg Intravenous BID  . losartan  12.5 mg Oral BID  . potassium chloride  40 mEq Oral Daily  . promethazine  12.5-25 mg Intravenous Once  . sodium chloride flush  3 mL Intravenous Q12H  . sodium chloride flush  3 mL Intravenous Q12H  . sodium chloride flush  3 mL Intravenous Q12H   Continuous: . heparin 1,850 Units/hr (09/27/15 0626)   WUJ:WJXBJY chloride, sodium chloride, acetaminophen, oxyCODONE, promethazine **OR** promethazine **OR** promethazine, sodium chloride flush, sodium chloride flush  Assessment/Plan:  Principal Problem:   Syncope Active Problems:   LBBB (left bundle branch block)   Elevated troponin   Cardiomyopathy (HCC)   Acute combined systolic and diastolic congestive heart failure (HCC)   Acute PE Patient found to have significant pulmonary embolism. Patient was started on IV heparin. Due to his clot burden, the plan is for catheter directed lysis. This will be done this morning by interventional radiology. His PE would explain his RV dysfunction. PE was most likely secondary to recent surgery. Eventually, he will need to be switched to oral anticoagulation.  Right  ventricular failure Patient also found to have acute pulmonary embolism as discussed above, which would explain his RV dysfunction. Initial echocardiogram showed EF of 30%. Cardiac MRI shows that his left ventricular ejection fraction is about 54%. His presentation is now more consistent with acute pulmonary embolism. Patient was started on ARB by cardiology. He was also started on spironolactone and was given Lasix. Spironolactone has been discontinued. TSH was normal at 2.3. However this morning it was slightly elevated at 5.7. Free T4 is normal. Will recommend repeating this in the next few weeks. No need for further investigations at this time.  Syncope thought to be secondary to orthostatic hypotension Syncope initially was felt to be due to orthostatic hypotension. However, now that he is found to have a primary embolus, this is the most likely explanation for his symptoms.   Status post recent bilateral  hernia repair. The incision sites appear to be stable. No evidence for infection. Pain is reasonably well controlled.  DVT Prophylaxis: Intravenous heparin Code Status: Full code  Family Communication: Discussed with the patient and his wife Disposition Plan: Catheter directed lysis today. Patient will need to be moved into the intensive care unit, subsequently. Cardiology will assume care at that time.    LOS: 1 day   [facility]  [facility] Pager [phone] 09/27/2015, 9:06 AM  If 7PM-7AM, please contact night-coverage at [email or web address], password [facility]

## 2015-09-27 NOTE — Progress Notes (Signed)
ANTICOAGULATION CONSULT NOTE - Follow-up Consult  Pharmacy Consult for heparin Indication: PE  Allergies  Allergen Reactions  . No Known Allergies     Patient Measurements: Height: 6\' 1"  (185.4 cm) Weight: 209 lb 11.2 oz (95.1 kg) (scale a) IBW/kg (Calculated) : 79.9 Heparin Dosing Weight: 98kg  Vital Signs: Temp: 98.4 F (36.9 C) (09/22 2010) Temp Source: Oral (09/22 2010) BP: 135/89 (09/22 2201) Pulse Rate: 91 (09/22 2201)  Labs:  Recent Labs  09/25/15 0352 09/25/15 0747 09/25/15 1222 09/26/15 0257 09/26/15 2315  HGB  --   --   --  13.2  --   HCT  --   --   --  41.0  --   PLT  --   --   --  250  --   LABPROT  --   --  16.1*  --   --   INR  --   --  1.28  --   --   HEPARINUNFRC  --   --   --   --  0.25*  CREATININE  --   --  0.84 0.96  --   TROPONINI 0.20* 0.29* 0.31*  --   --     Estimated Creatinine Clearance: 102.9 mL/min (by C-G formula based on SCr of 0.96 mg/dL).   Medical History: Past Medical History:  Diagnosis Date  . Headache    history migraines none in last few years   Assessment: 50 year old M on heparin for extensive b/l PE with R heart strain. Planning for EKOS cath directed lytics per IR. Heparin level subtherapeutic (0.25) on gtt at 1600 units/hr. No issues with line or bleeding reported per RN.  Noted pt with recent hernia repair 9/18.  Goal of Therapy:  Heparin level 0.3-0.7 units/ml Monitor platelets by anticoagulation protocol: Yes   Plan:  Rebolus heparin 1600 units Increase heparin infusion to 1850 units/hr Check heparin level in 6 hours   Christoper Fabianaron Fabien [place]stead, PharmD, BCPS Clinical pharmacist, pager [phone] 09/27/2015 12:35 AM

## 2015-09-27 NOTE — Sedation Documentation (Signed)
Patient denies pain and is resting comfortably.  

## 2015-09-27 NOTE — Progress Notes (Signed)
Patient arrived on dept from IR, alert and oriented. Dressing to rt neck intact. Echos in progress, TPA and Heparin iv infusion cont  per Dr Rica RecordsHassel order.

## 2015-09-27 NOTE — Progress Notes (Signed)
ANTICOAGULATION CONSULT NOTE - Follow-up Consult  Pharmacy Consult for heparin Indication: PE  Allergies  Allergen Reactions  . No Known Allergies     Patient Measurements: Height: 6\' 1"  (185.4 cm) Weight: 200 lb 9.6 oz (91 kg) IBW/kg (Calculated) : 79.9 Heparin Dosing Weight: 98kg  Vital Signs: Temp: 98.7 F (37.1 C) (09/23 0426) Temp Source: Oral (09/23 0426) BP: 127/82 (09/23 0426) Pulse Rate: 80 (09/23 0426)  Labs:  Recent Labs  09/25/15 0352 09/25/15 0747 09/25/15 1222 09/26/15 0257 09/26/15 2315 09/27/15 0630 09/27/15 0632  HGB  --   --   --  13.2  --   --  14.0  HCT  --   --   --  41.0  --   --  41.9  PLT  --   --   --  250  --   --  271  LABPROT  --   --  16.1*  --   --   --   --   INR  --   --  1.28  --   --   --   --   HEPARINUNFRC  --   --   --   --  0.25* 0.53  --   CREATININE  --   --  0.84 0.96  --   --  0.89  TROPONINI 0.20* 0.29* 0.31*  --   --   --   --     Estimated Creatinine Clearance: 111 mL/min (by C-G formula based on SCr of 0.89 mg/dL).   Medical History: Past Medical History:  Diagnosis Date  . Headache    history migraines none in last few years   Assessment: 50 year old M on heparin for extensive b/l PE with R heart strain. Planning for EKOS cath directed lytics per IR. Heparin level therapeutic (0.53) after 1600 units bolus and drip increased to 1850 units/hr. CBC stable, no bleeding reported.  Noted pt with recent hernia repair 9/18.  Goal of Therapy:  Heparin level 0.3-0.7 units/ml Monitor platelets by anticoagulation protocol: Yes   Plan:   continue heparin infusion at 1850 units/hr To go for catheter-directed lysis of PE this am To check LE U/S to assess LE clot burden - may need temporary IVC filter Plan to switch to apixaban after tpa lysis process complete Daily HL, CBC while on heaprin Herby AbrahamMichelle T. Lillyian Heidt, Pharm.D. [phone] 09/27/2015 10:35 AM

## 2015-09-27 NOTE — Progress Notes (Signed)
ANTICOAGULATION CONSULT NOTE - Follow-up Consult  Pharmacy Consult for heparin Indication: PE  Allergies  Allergen Reactions  . No Known Allergies     Patient Measurements: Height: 6\' 1"  (185.4 cm) Weight: 200 lb 9.6 oz (91 kg) IBW/kg (Calculated) : 79.9 Heparin Dosing Weight: 98kg  Vital Signs: Temp: 98.7 F (37.1 C) (09/23 0426) Temp Source: Oral (09/23 0426) BP: 136/86 (09/23 1400) Pulse Rate: 89 (09/23 1400)  Labs:  Recent Labs  09/25/15 0352 09/25/15 0747 09/25/15 1222  09/26/15 0257 09/26/15 2315 09/27/15 0630 09/27/15 0632 09/27/15 1440  HGB  --   --   --   < > 13.2  --   --  14.0 14.5  HCT  --   --   --   --  41.0  --   --  41.9 42.6  PLT  --   --   --   --  250  --   --  271 270  LABPROT  --   --  16.1*  --   --   --   --   --   --   INR  --   --  1.28  --   --   --   --   --   --   HEPARINUNFRC  --   --   --   --   --  0.25* 0.53  --  0.52  CREATININE  --   --  0.84  --  0.96  --   --  0.89  --   TROPONINI 0.20* 0.29* 0.31*  --   --   --   --   --   --   < > = values in this interval not displayed.  Estimated Creatinine Clearance: 111 mL/min (by C-G formula based on SCr of 0.89 mg/dL).   Medical History: Past Medical History:  Diagnosis Date  . Headache    history migraines none in last few years   Assessment: 50 year old M on heparin for extensive b/l PE with R heart strain. S/p lysis today and continues on IV heparin. Heparin level remains at goal at 0.52.   Goal of Therapy:  Heparin level 0.3-0.7 units/ml Monitor platelets by anticoagulation protocol: Yes   Plan:  - Continue heparin 1850 units/hr - Continue Q6H heparin level  Lysle Pearlachel Aleenah Homen, PharmD, BCPS Pager # [phone] 09/27/2015 3:44 PM

## 2015-09-27 NOTE — Procedures (Signed)
[facility] Ultrasound assisted bilateral pulmonary artery catheter directed thrombolysis started via R IJ approach x2 No complication No blood loss. [facility]. Recheck pressures in AM.

## 2015-09-27 NOTE — Consult Note (Signed)
PULMONARY / CRITICAL CARE MEDICINE   Name: Wayne Bolton MRN: [id number] DOB: 08/10/1965    ADMISSION DATE:  09/24/2015 CONSULTATION DATE:  09/27/15  REFERRING MD:  Dr. Gala RomneyBensimhon   CHIEF COMPLAINT:  PE   HISTORY OF PRESENT ILLNESS:  50 y/o M with PMH of migraines, LBBB (newly discovered 9/18), bilateral inguinal hernias s/p outpatient repair with insertion of mesh on 09/22/15 (per Dr. Abbey Chattersosenbower) who [facility] via EMS after passing out at home.  Per report he had nausea with vomiting after passing out.    Chart review shows that after outpatient surgery, he had difficulty urinating.  He was given IVF's during the case.  While walking to the bathroom, he became lightheaded, nauseated and pale.  SBP at that time was 100. He was hydrated and symptoms improved.  He again sat up attempting to use the urinal and became nauseated / lightheaded.  Per notes, telemetry reading noted HR 60-70's.  At that time, cardiology evaluation felt he was likely experiencing a vasovagal episode.    At home, he had onging vomiting and had episodes of syncope.  He attempted to shower, felt tired and had repeat episodes of lightheadedness while using the bathroom.  He walked approximately 715ft and passed out.  She activated EMS and he work before they arrived.   He was found to be orthostatic in the ER with repeat syncope.  [facility] with above complaints.  The patient was admitted for further evaluation of syncope.  He had a mild elevation of troponin (0.20, 0.29, 0.31).  ECHO evaluation was concerning for LVEF 30-35%, diffuse hypokinesis, grade 2 diastolic dysfunction, LBBB, mild AR, PA peak 35.  In the setting of syncope, he further underwent a R/LHC on 9/21 which showed minimal non-obstructive CAD, significantly elevated L/R heart filling pressures and moderate pulmonary hypertension (RA mean 20, wedge 26).  He was initially treated for acute combined systolic/diastolic CHF, NICM.  He  further had cardiac MRI which showed severe RV enlargement, hypokinesis EF 13%, normal LV size/function, no ASD/PFO or VSD.  Given cardiac MRI findings, a CTA of the chest was obtained 9/22 which showed acute PE with evidence of right heart strain (RV/LV ratio 1.2).  IR was consulted 9/22 PM for EKOS / catheter directed lysis.  Post procedure, the patient was transferred to ICU for observation. He is on a heparin infusion.  PCCM consulted for evaluation of submassive PE.   PAST MEDICAL HISTORY :  He  has a past medical history of Headache.  PAST SURGICAL HISTORY: He  has a past surgical history that includes Tonsillectomy; Inguinal hernia repair (Bilateral, 09/22/2015); Insertion of mesh (Bilateral, 09/22/2015); ir generic historical (09/27/2015); ir generic historical (09/27/2015); ir generic historical (09/27/2015); ir generic historical (09/27/2015); ir generic historical (09/27/2015); and ir generic historical (09/27/2015).  Allergies  Allergen Reactions  . No Known Allergies     No current facility-administered medications on file prior to encounter.    Current Outpatient Prescriptions on File Prior to Encounter  Medication Sig  . ibuprofen (ADVIL,MOTRIN) 200 MG tablet Take 400 mg by mouth every 6 (six) hours as needed for mild pain.  Marland Kitchen. oxyCODONE (OXY IR/ROXICODONE) 5 MG immediate release tablet Take 1-2 tablets (5-10 mg total) by mouth every 4 (four) hours as needed for moderate pain, severe pain or breakthrough pain.    FAMILY HISTORY:  His indicated that his mother is alive. He indicated that his father is alive. He indicated that  his paternal grandfather is deceased.    SOCIAL HISTORY: He  reports that he has never smoked. He has never used smokeless tobacco. He reports that he drinks alcohol. He reports that he does not use drugs.  REVIEW OF SYSTEMS:  POSITIVES IN BOLD  Gen: Denies fever, chills, weight change, fatigue, night sweats HEENT: Denies blurred vision, double vision,  hearing loss, tinnitus, sinus congestion, rhinorrhea, sore throat, neck stiffness, dysphagia PULM: per HPI CV: Denies chest pain, edema, orthopnea, paroxysmal nocturnal dyspnea, palpitations GI: Denies abdominal pain, nausea, vomiting, diarrhea, hematochezia, melena, constipation, change in bowel habits GU: Denies dysuria, hematuria, polyuria, oliguria, urethral discharge Endocrine: Denies hot or cold intolerance, polyuria, polyphagia or appetite change Derm: Denies rash, dry skin, scaling or peeling skin change Heme: Denies easy bruising, bleeding, bleeding gums Neuro: Denies headache, numbness, weakness, slurred speech, loss of memory or consciousness   SUBJECTIVE:    VITAL SIGNS: BP 133/90   Pulse 88   Temp 98.7 F (37.1 C) (Oral)   Resp (!) 24   Ht 6\' 1"  (1.854 m)   Wt 200 lb 9.6 oz (91 kg)   SpO2 93% Comment: RA  BMI 26.47 kg/m   HEMODYNAMICS:    VENTILATOR SETTINGS:    INTAKE / OUTPUT: I/O last 3 completed shifts: In: 1252.4 [P.O.:660; I.V.:592.4] Out: 5525 [Urine:5525]  PHYSICAL EXAMINATION: General:  Lying comfortably in bed Neuro:  Awake, alert, no distress HEENT:  NCAT OP clear Cardiovascular:  RRR, no mgr Lungs:  CTA B, normal effort Abdomen:  Belly soft, nontender, abdominal wounds well dressed, no bleeding Musculoskeletal:  Normal bulk and tone Skin:  No rash or skin breakdown  LABS:  BMET  Recent Labs Lab 09/25/15 1222 09/26/15 0257 09/27/15 0632  NA 142 136 137  K 3.9 3.8 3.5  CL 109 101 100*  CO2 25 25 23   BUN 7 9 15   CREATININE 0.84 0.96 0.89  GLUCOSE 99 93 102*    Electrolytes  Recent Labs Lab 09/25/15 1222 09/26/15 0257 09/27/15 0632  CALCIUM 8.7* 8.7* 9.3  MG 1.9  --   --     CBC  Recent Labs Lab 09/24/15 0020 09/26/15 0257 09/27/15 0632  WBC 11.7* 10.0 11.6*  HGB 13.8 13.2 14.0  HCT 42.2 41.0 41.9  PLT 196 250 271    Coag's  Recent Labs Lab 09/25/15 1222  INR 1.28    Sepsis Markers No results for  input(s): LATICACIDVEN, PROCALCITON, O2SATVEN in the last 168 hours.  ABG  Recent Labs Lab 09/25/15 1437  PHART 7.433  PCO2ART 32.1  PO2ART 58.0*    Liver Enzymes  Recent Labs Lab 09/26/15 0257  AST 57*  ALT 83*  ALKPHOS 42  BILITOT 1.0  ALBUMIN 3.2*    Cardiac Enzymes  Recent Labs Lab 09/25/15 0352 09/25/15 0747 09/25/15 1222  TROPONINI 0.20* 0.29* 0.31*    Glucose  Recent Labs Lab 09/24/15 2338 09/25/15 0619 09/26/15 0549 09/27/15 0536 09/27/15 1439  GLUCAP 133* 108* 101* 106* 102*    Imaging Ct Angio Chest Pe W Or Wo Contrast  Result Date: 09/26/2015 CLINICAL DATA:  Shortness of breath.  Four days postoperative EXAM: CT ANGIOGRAPHY CHEST WITH CONTRAST TECHNIQUE: Multidetector CT imaging of the chest was performed using the standard protocol during bolus administration of intravenous contrast. Multiplanar CT image reconstructions and MIPs were obtained to evaluate the vascular anatomy. CONTRAST:  100 mL Isovue 370 nonionic COMPARISON:  None. FINDINGS: Cardiovascular: There is extensive pulmonary embolus bilaterally. Pulmonary emboli arise from the  distal main pulmonary artery on the left extending into multiple upper and lower lobe branches. Pulmonary embolus on the right arises at the origin of the right intralobar pulmonary artery extending throughout the right lower lobe branches. There is right heart strain, evidenced by a right ventricle to left ventricle diameter ratio of 1.2, normal less than 0.9. There is prominence of the ascending thoracic aorta with a maximum transverse diameter of 4.0 x 3.9 cm. There is no thoracic aortic dissection. The visualized great vessels appear unremarkable. Pericardium is not appreciably thickened. Mediastinum/Nodes: Thyroid appears normal. There is no appreciable thoracic adenopathy. Lungs/Pleura: There is mild scarring in the lung apices. There is no parenchymal lung edema or consolidation. There is mild bibasilar atelectasis,  however. Upper Abdomen: There is hepatic steatosis. Visualized upper abdominal structures otherwise appear unremarkable. Musculoskeletal: There are no blastic or lytic bone lesions. Review of the MIP images confirms the above findings. IMPRESSION: Positive for acute PE with CT evidence of right heart strain (RV/LV Ratio = 1.2) consistent with at least submassive (intermediate risk) PE. The presence of right heart strain has been associated with an increased risk of morbidity and mortality. Please activate Code PE by paging [phone]. Prominence of the ascending thoracic aorta with a measured transverse diameter 4.0 x 3.9 cm. Recommend annual imaging followup by CTA or MRA. This recommendation follows 2010 ACCF/AHA/AATS/ACR/ASA/SCA/SCAI/SIR/STS/SVM Guidelines for the Diagnosis and Management of Patients with Thoracic Aortic Disease. Circulation. 2010; 121: U981-X914 Mild bibasilar atelectasis. No edema or consolidation. No evident adenopathy. There is hepatic steatosis. Critical Value/emergent results were called by telephone at the time of interpretation on 09/26/2015 at 4:38 pm to Dr. Rito Ehrlich, covering hospitalist, who verbally acknowledged these results. Electronically Signed   By: Bretta Bang III M.D.   On: 09/26/2015 16:39   Ir Angiogram Pulmonary Bilateral Selective  Result Date: 09/27/2015 INDICATION: Sub massive bilateral pulmonary emboli superimposed on baseline heart failure. Shortness of breath. Recent inguinal hernia repair surgery. EXAM: 1. ULTRASOUND GUIDANCE FOR VENOUS ACCESS X2 2. BILATERAL PULMONARY ARTERIOGRAPHY 3. FLUOROSCOPIC GUIDED PLACEMENT OF BILATERAL PULMONARY ARTERIAL LYTIC INFUSION CATHETERS COMPARISON:  CT chest 09/26/2015 MEDICATIONS: Intravenous Fentanyl and Versed were administered as conscious sedation during continuous monitoring of the patient's level of consciousness and physiological / cardiorespiratory status by the radiology RN, with a total moderate sedation time of  20 minutes. CONTRAST:  None administered FLUOROSCOPY TIME:  3.2 minutes, 287  uGym2 DAP COMPLICATIONS: None immediate TECHNIQUE: Informed written consent was obtained from the patient after a discussion of the risks, benefits and alternatives to treatment. Questions regarding the procedure were encouraged and answered. A timeout was performed prior to the initiation of the procedure. Ultrasound scanning demonstrated patency of the right internal jugular vein, selected for vascular access. The right neck was prepped and draped in the usual sterile fashion, and a sterile drape was applied covering the operative field. Maximum barrier sterile technique with sterile gowns and gloves were used for the procedure. A timeout was performed prior to the initiation of the procedure. Local anesthesia was provided with 1% lidocaine. Under direct ultrasound guidance, the right internal jugular vein was accessed with a micro puncture sheath ultimately allowing placement of a 6 French vascular sheath. Slightly cranial to this initial access, the right internal jugular vein was again accessed with a micropuncture sheath ultimately allowing placement of a 6 French vascular sheath. With the use of a guidewire, an angled pigtail catheter was advanced into the right main pulmonary artery . Pressure measurements were then  obtained from the right pulmonary artery. Over a Benson wire, the pigtail catheter was exchanged for a 18 cm multi side-hole EKOS ultrasound assisted infusion catheter. With the use of a guidewire, the angled pigtail catheter was advanced into the left main pulmonary artery . Over a Benson wire, the pigtail catheter was exchanged for a 12 cm multi side-hole EKOS ultrasound assisted infusion catheter. A postprocedural fluoroscopic image was obtained of the check demonstrating final catheter positioning. Both vascular sheath were secured at the right neck with interrupted 0 silk suture. The external catheter tubing was  secured and the lytic therapy was initiated. The patient tolerated the procedure well without immediate postprocedural complication. FINDINGS: Acquired pressure measurements: Right  pulmonary artery - 54/19 (mean 28) (normal: < 25/10) Following the procedure, both ultrasound assisted infusion catheter tips terminate within the distal aspects of the bilateral lower lobe sub segmental pulmonary arteries. IMPRESSION: 1. Successful fluoroscopic guided initiation of bilateral ultrasound assisted catheter directed pulmonary arterial lysis for sub massive pulmonary embolism and right-sided heart strain. 2. Markedly elevated pressure measurements within the right main pulmonary artery compatible with critical pulmonary arterial hypertension. PLAN: - The patient will return to the interventional radiology suite following the initiation of the catheter directed pulmonary arterial lysis, for repeat pressure measurements and either removal of the catheters or continuation of the catheter directed thrombolysis. Electronically Signed   By: Corlis Leak M.D.   On: 09/27/2015 13:29   Ir Angiogram Selective Each Additional Vessel  Result Date: 09/27/2015 INDICATION: Sub massive bilateral pulmonary emboli superimposed on baseline heart failure. Shortness of breath. Recent inguinal hernia repair surgery. EXAM: 1. ULTRASOUND GUIDANCE FOR VENOUS ACCESS X2 2. BILATERAL PULMONARY ARTERIOGRAPHY 3. FLUOROSCOPIC GUIDED PLACEMENT OF BILATERAL PULMONARY ARTERIAL LYTIC INFUSION CATHETERS COMPARISON:  CT chest 09/26/2015 MEDICATIONS: Intravenous Fentanyl and Versed were administered as conscious sedation during continuous monitoring of the patient's level of consciousness and physiological / cardiorespiratory status by the radiology RN, with a total moderate sedation time of 20 minutes. CONTRAST:  None administered FLUOROSCOPY TIME:  3.2 minutes, 287  uGym2 DAP COMPLICATIONS: None immediate TECHNIQUE: Informed written consent was obtained from  the patient after a discussion of the risks, benefits and alternatives to treatment. Questions regarding the procedure were encouraged and answered. A timeout was performed prior to the initiation of the procedure. Ultrasound scanning demonstrated patency of the right internal jugular vein, selected for vascular access. The right neck was prepped and draped in the usual sterile fashion, and a sterile drape was applied covering the operative field. Maximum barrier sterile technique with sterile gowns and gloves were used for the procedure. A timeout was performed prior to the initiation of the procedure. Local anesthesia was provided with 1% lidocaine. Under direct ultrasound guidance, the right internal jugular vein was accessed with a micro puncture sheath ultimately allowing placement of a 6 French vascular sheath. Slightly cranial to this initial access, the right internal jugular vein was again accessed with a micropuncture sheath ultimately allowing placement of a 6 French vascular sheath. With the use of a guidewire, an angled pigtail catheter was advanced into the right main pulmonary artery . Pressure measurements were then obtained from the right pulmonary artery. Over a Benson wire, the pigtail catheter was exchanged for a 18 cm multi side-hole EKOS ultrasound assisted infusion catheter. With the use of a guidewire, the angled pigtail catheter was advanced into the left main pulmonary artery . Over a Benson wire, the pigtail catheter was exchanged for a 12 cm multi  side-hole EKOS ultrasound assisted infusion catheter. A postprocedural fluoroscopic image was obtained of the check demonstrating final catheter positioning. Both vascular sheath were secured at the right neck with interrupted 0 silk suture. The external catheter tubing was secured and the lytic therapy was initiated. The patient tolerated the procedure well without immediate postprocedural complication. FINDINGS: Acquired pressure measurements:  Right  pulmonary artery - 54/19 (mean 28) (normal: < 25/10) Following the procedure, both ultrasound assisted infusion catheter tips terminate within the distal aspects of the bilateral lower lobe sub segmental pulmonary arteries. IMPRESSION: 1. Successful fluoroscopic guided initiation of bilateral ultrasound assisted catheter directed pulmonary arterial lysis for sub massive pulmonary embolism and right-sided heart strain. 2. Markedly elevated pressure measurements within the right main pulmonary artery compatible with critical pulmonary arterial hypertension. PLAN: - The patient will return to the interventional radiology suite following the initiation of the catheter directed pulmonary arterial lysis, for repeat pressure measurements and either removal of the catheters or continuation of the catheter directed thrombolysis. Electronically Signed   By: Corlis Leak M.D.   On: 09/27/2015 13:29   Ir Angiogram Selective Each Additional Vessel  Result Date: 09/27/2015 INDICATION: Sub massive bilateral pulmonary emboli superimposed on baseline heart failure. Shortness of breath. Recent inguinal hernia repair surgery. EXAM: 1. ULTRASOUND GUIDANCE FOR VENOUS ACCESS X2 2. BILATERAL PULMONARY ARTERIOGRAPHY 3. FLUOROSCOPIC GUIDED PLACEMENT OF BILATERAL PULMONARY ARTERIAL LYTIC INFUSION CATHETERS COMPARISON:  CT chest 09/26/2015 MEDICATIONS: Intravenous Fentanyl and Versed were administered as conscious sedation during continuous monitoring of the patient's level of consciousness and physiological / cardiorespiratory status by the radiology RN, with a total moderate sedation time of 20 minutes. CONTRAST:  None administered FLUOROSCOPY TIME:  3.2 minutes, 287  uGym2 DAP COMPLICATIONS: None immediate TECHNIQUE: Informed written consent was obtained from the patient after a discussion of the risks, benefits and alternatives to treatment. Questions regarding the procedure were encouraged and answered. A timeout was performed  prior to the initiation of the procedure. Ultrasound scanning demonstrated patency of the right internal jugular vein, selected for vascular access. The right neck was prepped and draped in the usual sterile fashion, and a sterile drape was applied covering the operative field. Maximum barrier sterile technique with sterile gowns and gloves were used for the procedure. A timeout was performed prior to the initiation of the procedure. Local anesthesia was provided with 1% lidocaine. Under direct ultrasound guidance, the right internal jugular vein was accessed with a micro puncture sheath ultimately allowing placement of a 6 French vascular sheath. Slightly cranial to this initial access, the right internal jugular vein was again accessed with a micropuncture sheath ultimately allowing placement of a 6 French vascular sheath. With the use of a guidewire, an angled pigtail catheter was advanced into the right main pulmonary artery . Pressure measurements were then obtained from the right pulmonary artery. Over a Benson wire, the pigtail catheter was exchanged for a 18 cm multi side-hole EKOS ultrasound assisted infusion catheter. With the use of a guidewire, the angled pigtail catheter was advanced into the left main pulmonary artery . Over a Benson wire, the pigtail catheter was exchanged for a 12 cm multi side-hole EKOS ultrasound assisted infusion catheter. A postprocedural fluoroscopic image was obtained of the check demonstrating final catheter positioning. Both vascular sheath were secured at the right neck with interrupted 0 silk suture. The external catheter tubing was secured and the lytic therapy was initiated. The patient tolerated the procedure well without immediate postprocedural complication. FINDINGS: Acquired pressure measurements:  Right  pulmonary artery - 54/19 (mean 28) (normal: < 25/10) Following the procedure, both ultrasound assisted infusion catheter tips terminate within the distal aspects of  the bilateral lower lobe sub segmental pulmonary arteries. IMPRESSION: 1. Successful fluoroscopic guided initiation of bilateral ultrasound assisted catheter directed pulmonary arterial lysis for sub massive pulmonary embolism and right-sided heart strain. 2. Markedly elevated pressure measurements within the right main pulmonary artery compatible with critical pulmonary arterial hypertension. PLAN: - The patient will return to the interventional radiology suite following the initiation of the catheter directed pulmonary arterial lysis, for repeat pressure measurements and either removal of the catheters or continuation of the catheter directed thrombolysis. Electronically Signed   By: Corlis Leak M.D.   On: 09/27/2015 13:29   Ir US Guide Vasc Access Right  Result Date: 09/27/2015 INDICATION: Sub massive bilateral pulmonary emboli superimposed on baseline heart failure. Shortness of breath. Recent inguinal hernia repair surgery. EXAM: 1. ULTRASOUND GUIDANCE FOR VENOUS ACCESS X2 2. BILATERAL PULMONARY ARTERIOGRAPHY 3. FLUOROSCOPIC GUIDED PLACEMENT OF BILATERAL PULMONARY ARTERIAL LYTIC INFUSION CATHETERS COMPARISON:  CT chest 09/26/2015 MEDICATIONS: Intravenous Fentanyl and Versed were administered as conscious sedation during continuous monitoring of the patient's level of consciousness and physiological / cardiorespiratory status by the radiology RN, with a total moderate sedation time of 20 minutes. CONTRAST:  None administered FLUOROSCOPY TIME:  3.2 minutes, 287  uGym2 DAP COMPLICATIONS: None immediate TECHNIQUE: Informed written consent was obtained from the patient after a discussion of the risks, benefits and alternatives to treatment. Questions regarding the procedure were encouraged and answered. A timeout was performed prior to the initiation of the procedure. Ultrasound scanning demonstrated patency of the right internal jugular vein, selected for vascular access. The right neck was prepped and draped in  the usual sterile fashion, and a sterile drape was applied covering the operative field. Maximum barrier sterile technique with sterile gowns and gloves were used for the procedure. A timeout was performed prior to the initiation of the procedure. Local anesthesia was provided with 1% lidocaine. Under direct ultrasound guidance, the right internal jugular vein was accessed with a micro puncture sheath ultimately allowing placement of a 6 French vascular sheath. Slightly cranial to this initial access, the right internal jugular vein was again accessed with a micropuncture sheath ultimately allowing placement of a 6 French vascular sheath. With the use of a guidewire, an angled pigtail catheter was advanced into the right main pulmonary artery . Pressure measurements were then obtained from the right pulmonary artery. Over a Benson wire, the pigtail catheter was exchanged for a 18 cm multi side-hole EKOS ultrasound assisted infusion catheter. With the use of a guidewire, the angled pigtail catheter was advanced into the left main pulmonary artery . Over a Benson wire, the pigtail catheter was exchanged for a 12 cm multi side-hole EKOS ultrasound assisted infusion catheter. A postprocedural fluoroscopic image was obtained of the check demonstrating final catheter positioning. Both vascular sheath were secured at the right neck with interrupted 0 silk suture. The external catheter tubing was secured and the lytic therapy was initiated. The patient tolerated the procedure well without immediate postprocedural complication. FINDINGS: Acquired pressure measurements: Right  pulmonary artery - 54/19 (mean 28) (normal: < 25/10) Following the procedure, both ultrasound assisted infusion catheter tips terminate within the distal aspects of the bilateral lower lobe sub segmental pulmonary arteries. IMPRESSION: 1. Successful fluoroscopic guided initiation of bilateral ultrasound assisted catheter directed pulmonary arterial  lysis for sub massive pulmonary embolism and right-sided heart strain.  2. Markedly elevated pressure measurements within the right main pulmonary artery compatible with critical pulmonary arterial hypertension. PLAN: - The patient will return to the interventional radiology suite following the initiation of the catheter directed pulmonary arterial lysis, for repeat pressure measurements and either removal of the catheters or continuation of the catheter directed thrombolysis. Electronically Signed   By: Corlis Leak M.D.   On: 09/27/2015 13:29   Ir Infusion Thrombol Arterial Initial (ms)  Result Date: 09/27/2015 INDICATION: Sub massive bilateral pulmonary emboli superimposed on baseline heart failure. Shortness of breath. Recent inguinal hernia repair surgery. EXAM: 1. ULTRASOUND GUIDANCE FOR VENOUS ACCESS X2 2. BILATERAL PULMONARY ARTERIOGRAPHY 3. FLUOROSCOPIC GUIDED PLACEMENT OF BILATERAL PULMONARY ARTERIAL LYTIC INFUSION CATHETERS COMPARISON:  CT chest 09/26/2015 MEDICATIONS: Intravenous Fentanyl and Versed were administered as conscious sedation during continuous monitoring of the patient's level of consciousness and physiological / cardiorespiratory status by the radiology RN, with a total moderate sedation time of 20 minutes. CONTRAST:  None administered FLUOROSCOPY TIME:  3.2 minutes, 287  uGym2 DAP COMPLICATIONS: None immediate TECHNIQUE: Informed written consent was obtained from the patient after a discussion of the risks, benefits and alternatives to treatment. Questions regarding the procedure were encouraged and answered. A timeout was performed prior to the initiation of the procedure. Ultrasound scanning demonstrated patency of the right internal jugular vein, selected for vascular access. The right neck was prepped and draped in the usual sterile fashion, and a sterile drape was applied covering the operative field. Maximum barrier sterile technique with sterile gowns and gloves were used for the  procedure. A timeout was performed prior to the initiation of the procedure. Local anesthesia was provided with 1% lidocaine. Under direct ultrasound guidance, the right internal jugular vein was accessed with a micro puncture sheath ultimately allowing placement of a 6 French vascular sheath. Slightly cranial to this initial access, the right internal jugular vein was again accessed with a micropuncture sheath ultimately allowing placement of a 6 French vascular sheath. With the use of a guidewire, an angled pigtail catheter was advanced into the right main pulmonary artery . Pressure measurements were then obtained from the right pulmonary artery. Over a Benson wire, the pigtail catheter was exchanged for a 18 cm multi side-hole EKOS ultrasound assisted infusion catheter. With the use of a guidewire, the angled pigtail catheter was advanced into the left main pulmonary artery . Over a Benson wire, the pigtail catheter was exchanged for a 12 cm multi side-hole EKOS ultrasound assisted infusion catheter. A postprocedural fluoroscopic image was obtained of the check demonstrating final catheter positioning. Both vascular sheath were secured at the right neck with interrupted 0 silk suture. The external catheter tubing was secured and the lytic therapy was initiated. The patient tolerated the procedure well without immediate postprocedural complication. FINDINGS: Acquired pressure measurements: Right  pulmonary artery - 54/19 (mean 28) (normal: < 25/10) Following the procedure, both ultrasound assisted infusion catheter tips terminate within the distal aspects of the bilateral lower lobe sub segmental pulmonary arteries. IMPRESSION: 1. Successful fluoroscopic guided initiation of bilateral ultrasound assisted catheter directed pulmonary arterial lysis for sub massive pulmonary embolism and right-sided heart strain. 2. Markedly elevated pressure measurements within the right main pulmonary artery compatible with  critical pulmonary arterial hypertension. PLAN: - The patient will return to the interventional radiology suite following the initiation of the catheter directed pulmonary arterial lysis, for repeat pressure measurements and either removal of the catheters or continuation of the catheter directed thrombolysis. Electronically Signed  By: Corlis Leak M.D.   On: 09/27/2015 13:29   Ir Infusion Thrombol Arterial Initial (ms)  Result Date: 09/27/2015 INDICATION: Sub massive bilateral pulmonary emboli superimposed on baseline heart failure. Shortness of breath. Recent inguinal hernia repair surgery. EXAM: 1. ULTRASOUND GUIDANCE FOR VENOUS ACCESS X2 2. BILATERAL PULMONARY ARTERIOGRAPHY 3. FLUOROSCOPIC GUIDED PLACEMENT OF BILATERAL PULMONARY ARTERIAL LYTIC INFUSION CATHETERS COMPARISON:  CT chest 09/26/2015 MEDICATIONS: Intravenous Fentanyl and Versed were administered as conscious sedation during continuous monitoring of the patient's level of consciousness and physiological / cardiorespiratory status by the radiology RN, with a total moderate sedation time of 20 minutes. CONTRAST:  None administered FLUOROSCOPY TIME:  3.2 minutes, 287  uGym2 DAP COMPLICATIONS: None immediate TECHNIQUE: Informed written consent was obtained from the patient after a discussion of the risks, benefits and alternatives to treatment. Questions regarding the procedure were encouraged and answered. A timeout was performed prior to the initiation of the procedure. Ultrasound scanning demonstrated patency of the right internal jugular vein, selected for vascular access. The right neck was prepped and draped in the usual sterile fashion, and a sterile drape was applied covering the operative field. Maximum barrier sterile technique with sterile gowns and gloves were used for the procedure. A timeout was performed prior to the initiation of the procedure. Local anesthesia was provided with 1% lidocaine. Under direct ultrasound guidance, the right  internal jugular vein was accessed with a micro puncture sheath ultimately allowing placement of a 6 French vascular sheath. Slightly cranial to this initial access, the right internal jugular vein was again accessed with a micropuncture sheath ultimately allowing placement of a 6 French vascular sheath. With the use of a guidewire, an angled pigtail catheter was advanced into the right main pulmonary artery . Pressure measurements were then obtained from the right pulmonary artery. Over a Benson wire, the pigtail catheter was exchanged for a 18 cm multi side-hole EKOS ultrasound assisted infusion catheter. With the use of a guidewire, the angled pigtail catheter was advanced into the left main pulmonary artery . Over a Benson wire, the pigtail catheter was exchanged for a 12 cm multi side-hole EKOS ultrasound assisted infusion catheter. A postprocedural fluoroscopic image was obtained of the check demonstrating final catheter positioning. Both vascular sheath were secured at the right neck with interrupted 0 silk suture. The external catheter tubing was secured and the lytic therapy was initiated. The patient tolerated the procedure well without immediate postprocedural complication. FINDINGS: Acquired pressure measurements: Right  pulmonary artery - 54/19 (mean 28) (normal: < 25/10) Following the procedure, both ultrasound assisted infusion catheter tips terminate within the distal aspects of the bilateral lower lobe sub segmental pulmonary arteries. IMPRESSION: 1. Successful fluoroscopic guided initiation of bilateral ultrasound assisted catheter directed pulmonary arterial lysis for sub massive pulmonary embolism and right-sided heart strain. 2. Markedly elevated pressure measurements within the right main pulmonary artery compatible with critical pulmonary arterial hypertension. PLAN: - The patient will return to the interventional radiology suite following the initiation of the catheter directed pulmonary  arterial lysis, for repeat pressure measurements and either removal of the catheters or continuation of the catheter directed thrombolysis. Electronically Signed   By: Corlis Leak M.D.   On: 09/27/2015 13:29     STUDIES:  ECHO 9/21 >> LVEF 30-35%, diffuse hypokinesis, grade 2 diastolic dysfunction, LBBB, mild AR, PA peak 35.   R/LHC on 9/21 >> minimal non-obstructive CAD, significantly elevated L/R heart filling pressures and moderate pulmonary hypertension (RA mean 20, wedge 26) Cardiac MRI  9/22 >> severe RV enlargement, hypokinesis EF 13%, normal LV size/function, no ASD/PFO or VSD. CTA Chest 9/22 >> acute PE with evidence of right heart strain (RV/LV ratio 1.2).   LE Doppler 9/23 >>  CULTURES:   ANTIBIOTICS:   SIGNIFICANT EVENTS: 9/18  Inguinal hernia repair with mesh as outpatient, episodes of pre-syncope evaluated by Cardiology  9/20  Admit with sycope   LINES/TUBES:   DISCUSSION: 50 y/o M with recent hernia repair admitted with submassive PE.    ASSESSMENT / PLAN:  PULMONARY A: Acute PE - submassive, RV/LV ratio 1.2, HD stable, provoked P:   ICU monitoring  O2 for sats > 92% Appreciate IR assistance with catheter directed lysis Continue heparin infusion F/U LE doppler ultrasound Will need 6 months of anticoagulation  CARDIOVASCULAR A:  Syncope - in setting of PE  Elevated Troponin  LBBB    Non-obstructive CAD  P:  Cardiology following Hemodynamic monitoring in ICU   RENAL A:   No acute issues  P:   Trend BMP  Replace electrolytes as indicated   GASTROINTESTINAL A:   Recent Hernia Repair - 9/18 with mesh insertion  Nausea  P:   Diet as tolerated  Monitor for bleeding  PRN zofran for nausea  HEMATOLOGIC A:   PE R/O DVT At Risk Bleeding / Anemia  P:  Trend CBC  Await LE doppler  Monitor for bleeding Check for Factor VL and prothrombin gene mutation  INFECTIOUS A:   No acute infectious process  P:   Monitor fever curve / WBC    NEUROLOGIC A:   Post-Operative Pain  P:   RASS goal: n/a PRN oxycodone   FAMILY  - Updates: wife updated bedside with son  - Inter-disciplinary family meet or Palliative Care meeting due by:  day 7  Heber Navassa, MD [facility] Pager: [phone] Cell: [phone] After 3pm or if no response, call [phone]  09/27/2015, 2:42 PM

## 2015-09-27 NOTE — Progress Notes (Signed)
[facility]16 [facility]

## 2015-09-28 ENCOUNTER — Encounter (HOSPITAL_COMMUNITY): Payer: Self-pay | Admitting: Interventional Radiology

## 2015-09-28 ENCOUNTER — Inpatient Hospital Stay (HOSPITAL_COMMUNITY): Payer: 59

## 2015-09-28 DIAGNOSIS — I5041 Acute combined systolic (congestive) and diastolic (congestive) heart failure: Secondary | ICD-10-CM

## 2015-09-28 DIAGNOSIS — R609 Edema, unspecified: Secondary | ICD-10-CM

## 2015-09-28 HISTORY — PX: IR GENERIC HISTORICAL: IMG1180011

## 2015-09-28 LAB — CBC
HEMATOCRIT: 39.5 % (ref 39.0–52.0)
HEMATOCRIT: 38.8 % — AB (ref 39.0–52.0)
HEMOGLOBIN: 13.2 g/dL (ref 13.0–17.0)
HEMOGLOBIN: 13.2 g/dL (ref 13.0–17.0)
MCH: 30.6 pg (ref 26.0–34.0)
MCH: 30.8 pg (ref 26.0–34.0)
MCHC: 33.4 g/dL (ref 30.0–36.0)
MCHC: 34 g/dL (ref 30.0–36.0)
MCV: 91.4 fL (ref 78.0–100.0)
MCV: 90.7 fL (ref 78.0–100.0)
PLATELETS: 234 10*3/uL (ref 150–400)
Platelets: 261 10*3/uL (ref 150–400)
RBC: 4.32 MIL/uL (ref 4.22–5.81)
RBC: 4.28 MIL/uL (ref 4.22–5.81)
RDW: 12.5 % (ref 11.5–15.5)
RDW: 12.7 % (ref 11.5–15.5)
WBC: 13 10*3/uL — ABNORMAL HIGH (ref 4.0–10.5)
WBC: 12.4 10*3/uL — AB (ref 4.0–10.5)

## 2015-09-28 LAB — HEPATITIS PANEL, ACUTE
HCV Ab: <0.1 s/co ratio (ref 0.0–0.9)
HEP B C IGM: NEGATIVE
HEP B S AG: NEGATIVE
Hep A IgM: NEGATIVE

## 2015-09-28 LAB — BASIC METABOLIC PANEL
ANION GAP: 8 (ref 5–15)
BUN: 15 mg/dL (ref 6–20)
CHLORIDE: 104 mmol/L (ref 101–111)
CO2: 23 mmol/L (ref 22–32)
Calcium: 8.6 mg/dL — ABNORMAL LOW (ref 8.9–10.3)
Creatinine, Ser: 0.8 mg/dL (ref 0.61–1.24)
GFR calc non Af Amer: >60 mL/min (ref >60)
GLUCOSE: 111 mg/dL — AB (ref 65–99)
POTASSIUM: 3.7 mmol/L (ref 3.5–5.1)
Sodium: 135 mmol/L (ref 135–145)

## 2015-09-28 LAB — FIBRINOGEN
Fibrinogen: 583 mg/dL — ABNORMAL HIGH (ref 210–475)
Fibrinogen: 618 mg/dL — ABNORMAL HIGH (ref 210–475)

## 2015-09-28 LAB — GLUCOSE, CAPILLARY: Glucose-Capillary: 109 mg/dL — ABNORMAL HIGH (ref 65–99)

## 2015-09-28 LAB — RHEUMATOID FACTOR: Rhuematoid fact SerPl-aCnc: 10.2 IU/mL (ref 0.0–13.9)

## 2015-09-28 LAB — HEPARIN LEVEL (UNFRACTIONATED)
HEPARIN UNFRACTIONATED: 0.51 [IU]/mL (ref 0.30–0.70)
Heparin Unfractionated: 0.55 IU/mL (ref 0.30–0.70)

## 2015-09-28 NOTE — Progress Notes (Signed)
Spoke to Dr. Rica RecordsHassel about preliminary results. See new orders

## 2015-09-28 NOTE — Progress Notes (Signed)
Advanced Heart Failure Rounding Note  PCP: None Primary Cardiologist: Dr. [name]  Subjective:    Admitted 09/25/15 after syncopal episode. Was recently admitted for Inguinal hernia repair on 9/18, had syncope after surgery. LBBB was noted, felt to be vaso-vagal. Sent home with plans for Echo and Stress as outpatient.  Pt had recurrence and thus re-presented to ED.  Had + Orthostatics.     Echo positive for LV dysfunction + mild RV dysfunction. L/RHC with non obstructive CAD, elevated filling pressures and depressed cardiac output. HF team consulted to further evaluate LV/RV dysfunction.  Echo 09/25/15 LVEF 30-35%, Grade 2 DD, LBBB with septal motion abnormality, mild AI, RV mildly dilated and reduced, Mild RAE, Mod TR, PA peak pressure 35 mm Hg.  R/LHC 09/25/15 Fick CO 3.85 Fick CI 1.73 RA mean 20 RV EDP 24 PA 56/27 (37) PW 32/24 (26)  Also showed minimal, non-obstructive CAD, with 30% stenosis at the ostium of the first diagonal branch.  cMRI LVEF 54% severe RV dysfunction  Chest CT 09/26/15: submassive bilateral PE with RV strain  Started on EKOS on 9/23. Feels much better. BP up. Breathing better. No bleeding evident. Hgb stable.   We checked PA pressures at bedside and PAs down from 54 to 32.   Objective:   Weight Range: 90.4 kg (199 lb 4.7 oz) Body mass index is 26.29 kg/m.   Vital Signs:   Temp:  [98.6 F (37 C)-99.7 F (37.6 C)] 98.8 F (37.1 C) (09/24 0744) Pulse Rate:  [20-101] 68 (09/24 0800) Resp:  [16-33] 21 (09/24 0800) BP: (123-156)/(77-103) 142/95 (09/24 0800) SpO2:  [91 %-99 %] 97 % (09/24 0800) Weight:  [90.4 kg (199 lb 4.7 oz)] 90.4 kg (199 lb 4.7 oz) (09/24 0200) Last BM Date: 09/22/15  Weight change: Filed Weights   09/26/15 0415 09/27/15 0428 09/28/15 0200  Weight: 95.1 kg (209 lb 11.2 oz) 91 kg (200 lb 9.6 oz) 90.4 kg (199 lb 4.7 oz)    Intake/Output:   Intake/Output Summary (Last 24 hours) at 09/28/15 0901 Last data filed at 09/28/15  0800  Gross per 24 hour  Intake          1993.37 ml  Output             1200 ml  Net           793.37 ml     Physical Exam:  General:  Well appearing. No resp difficulty HEENT: normal Neck: supple. RIJ EKOS catheter Carotids 2+ bilat; no bruits. No lymphadenopathy or thyromegaly appreciated. Cor: PMI nondisplaced. Regular rate & rhythm. No rubs, gallops or murmurs. Lungs: CTAB, normal effort Abdomen: soft, NT, ND, no HSM. No bruits or masses. +BS  Extremities: no cyanosis, clubbing, rash, RLE swollen Neuro: alert & orientedx3, cranial nerves grossly intact. moves all 4 extremities w/o difficulty. Affect pleasant  Telemetry:  NSR  Labs: CBC  Recent Labs  09/27/15 0632  09/28/15 0300 09/28/15 0834  WBC 11.6*  < > 13.0* 12.4*  NEUTROABS 7.8*  --   --   --   HGB 14.0  < > 13.2 13.2  HCT 41.9  < > 39.5 38.8*  MCV 91.9  < > 91.4 90.7  PLT 271  < > 261 234  < > = values in this interval not displayed. Basic Metabolic Panel  Recent Labs  09/25/15 1222  09/27/15 0632 09/28/15 0300  NA 142  < > 137 135  K 3.9  < > 3.5 3.7  CL 109  < > 100* 104  CO2 25  < > 23 23  GLUCOSE 99  < > 102* 111*  BUN 7  < > 15 15  CREATININE 0.84  < > 0.89 0.80  CALCIUM 8.7*  < > 9.3 8.6*  MG 1.9  --   --   --   < > = values in this interval not displayed. Liver Function Tests  Recent Labs  09/26/15 0257  AST 57*  ALT 83*  ALKPHOS 42  BILITOT 1.0  PROT 5.9*  ALBUMIN 3.2*   No results for input(s): LIPASE, AMYLASE in the last 72 hours. Cardiac Enzymes  Recent Labs  09/25/15 1222  TROPONINI 0.31*    BNP: BNP (last 3 results)  Recent Labs  09/26/15 0257  BNP 284.2*    ProBNP (last 3 results) No results for input(s): PROBNP in the last 8760 hours.   D-Dimer  Recent Labs  09/26/15 1550  DDIMER 11.22*   Hemoglobin A1C No results for input(s): HGBA1C in the last 72 hours. Fasting Lipid Panel No results for input(s): CHOL, HDL, LDLCALC, TRIG, CHOLHDL, LDLDIRECT in  the last 72 hours. Thyroid Function Tests  Recent Labs  09/26/15 0257 09/26/15 0955  TSH 5.725*  --   T3FREE  --  2.4    Other results:     Imaging/Studies:  Ct Angio Chest Pe W Or Wo Contrast  Result Date: 09/26/2015 CLINICAL DATA:  Shortness of breath.  Four days postoperative EXAM: CT ANGIOGRAPHY CHEST WITH CONTRAST TECHNIQUE: Multidetector CT imaging of the chest was performed using the standard protocol during bolus administration of intravenous contrast. Multiplanar CT image reconstructions and MIPs were obtained to evaluate the vascular anatomy. CONTRAST:  100 mL Isovue 370 nonionic COMPARISON:  None. FINDINGS: Cardiovascular: There is extensive pulmonary embolus bilaterally. Pulmonary emboli arise from the distal main pulmonary artery on the left extending into multiple upper and lower lobe branches. Pulmonary embolus on the right arises at the origin of the right intralobar pulmonary artery extending throughout the right lower lobe branches. There is right heart strain, evidenced by a right ventricle to left ventricle diameter ratio of 1.2, normal less than 0.9. There is prominence of the ascending thoracic aorta with a maximum transverse diameter of 4.0 x 3.9 cm. There is no thoracic aortic dissection. The visualized great vessels appear unremarkable. Pericardium is not appreciably thickened. Mediastinum/Nodes: Thyroid appears normal. There is no appreciable thoracic adenopathy. Lungs/Pleura: There is mild scarring in the lung apices. There is no parenchymal lung edema or consolidation. There is mild bibasilar atelectasis, however. Upper Abdomen: There is hepatic steatosis. Visualized upper abdominal structures otherwise appear unremarkable. Musculoskeletal: There are no blastic or lytic bone lesions. Review of the MIP images confirms the above findings. IMPRESSION: Positive for acute PE with CT evidence of right heart strain (RV/LV Ratio = 1.2) consistent with at least submassive  (intermediate risk) PE. The presence of right heart strain has been associated with an increased risk of morbidity and mortality. Please activate Code PE by paging [phone]. Prominence of the ascending thoracic aorta with a measured transverse diameter 4.0 x 3.9 cm. Recommend annual imaging followup by CTA or MRA. This recommendation follows 2010 ACCF/AHA/AATS/ACR/ASA/SCA/SCAI/SIR/STS/SVM Guidelines for the Diagnosis and Management of Patients with Thoracic Aortic Disease. Circulation. 2010; 121: G956-O130 Mild bibasilar atelectasis. No edema or consolidation. No evident adenopathy. There is hepatic steatosis. Critical Value/emergent results were called by telephone at the time of interpretation on 09/26/2015 at 4:38 pm to Dr. Rito Ehrlich,  covering hospitalist, who verbally acknowledged these results. Electronically Signed   By: Bretta BangWilliam  Woodruff III M.D.   On: 09/26/2015 16:39   Ir Angiogram Pulmonary Bilateral Selective  Result Date: 09/27/2015 INDICATION: Sub massive bilateral pulmonary emboli superimposed on baseline heart failure. Shortness of breath. Recent inguinal hernia repair surgery. EXAM: 1. ULTRASOUND GUIDANCE FOR VENOUS ACCESS X2 2. BILATERAL PULMONARY ARTERIOGRAPHY 3. FLUOROSCOPIC GUIDED PLACEMENT OF BILATERAL PULMONARY ARTERIAL LYTIC INFUSION CATHETERS COMPARISON:  CT chest 09/26/2015 MEDICATIONS: Intravenous Fentanyl and Versed were administered as conscious sedation during continuous monitoring of the patient's level of consciousness and physiological / cardiorespiratory status by the radiology RN, with a total moderate sedation time of 20 minutes. CONTRAST:  None administered FLUOROSCOPY TIME:  3.2 minutes, 287  uGym2 DAP COMPLICATIONS: None immediate TECHNIQUE: Informed written consent was obtained from the patient after a discussion of the risks, benefits and alternatives to treatment. Questions regarding the procedure were encouraged and answered. A timeout was performed prior to the  initiation of the procedure. Ultrasound scanning demonstrated patency of the right internal jugular vein, selected for vascular access. The right neck was prepped and draped in the usual sterile fashion, and a sterile drape was applied covering the operative field. Maximum barrier sterile technique with sterile gowns and gloves were used for the procedure. A timeout was performed prior to the initiation of the procedure. Local anesthesia was provided with 1% lidocaine. Under direct ultrasound guidance, the right internal jugular vein was accessed with a micro puncture sheath ultimately allowing placement of a 6 French vascular sheath. Slightly cranial to this initial access, the right internal jugular vein was again accessed with a micropuncture sheath ultimately allowing placement of a 6 French vascular sheath. With the use of a guidewire, an angled pigtail catheter was advanced into the right main pulmonary artery . Pressure measurements were then obtained from the right pulmonary artery. Over a Benson wire, the pigtail catheter was exchanged for a 18 cm multi side-hole EKOS ultrasound assisted infusion catheter. With the use of a guidewire, the angled pigtail catheter was advanced into the left main pulmonary artery . Over a Benson wire, the pigtail catheter was exchanged for a 12 cm multi side-hole EKOS ultrasound assisted infusion catheter. A postprocedural fluoroscopic image was obtained of the check demonstrating final catheter positioning. Both vascular sheath were secured at the right neck with interrupted 0 silk suture. The external catheter tubing was secured and the lytic therapy was initiated. The patient tolerated the procedure well without immediate postprocedural complication. FINDINGS: Acquired pressure measurements: Right  pulmonary artery - 54/19 (mean 28) (normal: < 25/10) Following the procedure, both ultrasound assisted infusion catheter tips terminate within the distal aspects of the bilateral  lower lobe sub segmental pulmonary arteries. IMPRESSION: 1. Successful fluoroscopic guided initiation of bilateral ultrasound assisted catheter directed pulmonary arterial lysis for sub massive pulmonary embolism and right-sided heart strain. 2. Markedly elevated pressure measurements within the right main pulmonary artery compatible with critical pulmonary arterial hypertension. PLAN: - The patient will return to the interventional radiology suite following the initiation of the catheter directed pulmonary arterial lysis, for repeat pressure measurements and either removal of the catheters or continuation of the catheter directed thrombolysis. Electronically Signed   By: Corlis Leak  Hassell M.D.   On: 09/27/2015 13:29   Ir Angiogram Selective Each Additional Vessel  Result Date: 09/27/2015 INDICATION: Sub massive bilateral pulmonary emboli superimposed on baseline heart failure. Shortness of breath. Recent inguinal hernia repair surgery. EXAM: 1. ULTRASOUND GUIDANCE FOR VENOUS ACCESS  X2 2. BILATERAL PULMONARY ARTERIOGRAPHY 3. FLUOROSCOPIC GUIDED PLACEMENT OF BILATERAL PULMONARY ARTERIAL LYTIC INFUSION CATHETERS COMPARISON:  CT chest 09/26/2015 MEDICATIONS: Intravenous Fentanyl and Versed were administered as conscious sedation during continuous monitoring of the patient's level of consciousness and physiological / cardiorespiratory status by the radiology RN, with a total moderate sedation time of 20 minutes. CONTRAST:  None administered FLUOROSCOPY TIME:  3.2 minutes, 287  uGym2 DAP COMPLICATIONS: None immediate TECHNIQUE: Informed written consent was obtained from the patient after a discussion of the risks, benefits and alternatives to treatment. Questions regarding the procedure were encouraged and answered. A timeout was performed prior to the initiation of the procedure. Ultrasound scanning demonstrated patency of the right internal jugular vein, selected for vascular access. The right neck was prepped and draped  in the usual sterile fashion, and a sterile drape was applied covering the operative field. Maximum barrier sterile technique with sterile gowns and gloves were used for the procedure. A timeout was performed prior to the initiation of the procedure. Local anesthesia was provided with 1% lidocaine. Under direct ultrasound guidance, the right internal jugular vein was accessed with a micro puncture sheath ultimately allowing placement of a 6 French vascular sheath. Slightly cranial to this initial access, the right internal jugular vein was again accessed with a micropuncture sheath ultimately allowing placement of a 6 French vascular sheath. With the use of a guidewire, an angled pigtail catheter was advanced into the right main pulmonary artery . Pressure measurements were then obtained from the right pulmonary artery. Over a Benson wire, the pigtail catheter was exchanged for a 18 cm multi side-hole EKOS ultrasound assisted infusion catheter. With the use of a guidewire, the angled pigtail catheter was advanced into the left main pulmonary artery . Over a Benson wire, the pigtail catheter was exchanged for a 12 cm multi side-hole EKOS ultrasound assisted infusion catheter. A postprocedural fluoroscopic image was obtained of the check demonstrating final catheter positioning. Both vascular sheath were secured at the right neck with interrupted 0 silk suture. The external catheter tubing was secured and the lytic therapy was initiated. The patient tolerated the procedure well without immediate postprocedural complication. FINDINGS: Acquired pressure measurements: Right  pulmonary artery - 54/19 (mean 28) (normal: < 25/10) Following the procedure, both ultrasound assisted infusion catheter tips terminate within the distal aspects of the bilateral lower lobe sub segmental pulmonary arteries. IMPRESSION: 1. Successful fluoroscopic guided initiation of bilateral ultrasound assisted catheter directed pulmonary arterial  lysis for sub massive pulmonary embolism and right-sided heart strain. 2. Markedly elevated pressure measurements within the right main pulmonary artery compatible with critical pulmonary arterial hypertension. PLAN: - The patient will return to the interventional radiology suite following the initiation of the catheter directed pulmonary arterial lysis, for repeat pressure measurements and either removal of the catheters or continuation of the catheter directed thrombolysis. Electronically Signed   By: Corlis Leak M.D.   On: 09/27/2015 13:29   Ir Angiogram Selective Each Additional Vessel  Result Date: 09/27/2015 INDICATION: Sub massive bilateral pulmonary emboli superimposed on baseline heart failure. Shortness of breath. Recent inguinal hernia repair surgery. EXAM: 1. ULTRASOUND GUIDANCE FOR VENOUS ACCESS X2 2. BILATERAL PULMONARY ARTERIOGRAPHY 3. FLUOROSCOPIC GUIDED PLACEMENT OF BILATERAL PULMONARY ARTERIAL LYTIC INFUSION CATHETERS COMPARISON:  CT chest 09/26/2015 MEDICATIONS: Intravenous Fentanyl and Versed were administered as conscious sedation during continuous monitoring of the patient's level of consciousness and physiological / cardiorespiratory status by the radiology RN, with a total moderate sedation time of 20 minutes. CONTRAST:  None administered FLUOROSCOPY TIME:  3.2 minutes, 287  uGym2 DAP COMPLICATIONS: None immediate TECHNIQUE: Informed written consent was obtained from the patient after a discussion of the risks, benefits and alternatives to treatment. Questions regarding the procedure were encouraged and answered. A timeout was performed prior to the initiation of the procedure. Ultrasound scanning demonstrated patency of the right internal jugular vein, selected for vascular access. The right neck was prepped and draped in the usual sterile fashion, and a sterile drape was applied covering the operative field. Maximum barrier sterile technique with sterile gowns and gloves were used for  the procedure. A timeout was performed prior to the initiation of the procedure. Local anesthesia was provided with 1% lidocaine. Under direct ultrasound guidance, the right internal jugular vein was accessed with a micro puncture sheath ultimately allowing placement of a 6 French vascular sheath. Slightly cranial to this initial access, the right internal jugular vein was again accessed with a micropuncture sheath ultimately allowing placement of a 6 French vascular sheath. With the use of a guidewire, an angled pigtail catheter was advanced into the right main pulmonary artery . Pressure measurements were then obtained from the right pulmonary artery. Over a Benson wire, the pigtail catheter was exchanged for a 18 cm multi side-hole EKOS ultrasound assisted infusion catheter. With the use of a guidewire, the angled pigtail catheter was advanced into the left main pulmonary artery . Over a Benson wire, the pigtail catheter was exchanged for a 12 cm multi side-hole EKOS ultrasound assisted infusion catheter. A postprocedural fluoroscopic image was obtained of the check demonstrating final catheter positioning. Both vascular sheath were secured at the right neck with interrupted 0 silk suture. The external catheter tubing was secured and the lytic therapy was initiated. The patient tolerated the procedure well without immediate postprocedural complication. FINDINGS: Acquired pressure measurements: Right  pulmonary artery - 54/19 (mean 28) (normal: < 25/10) Following the procedure, both ultrasound assisted infusion catheter tips terminate within the distal aspects of the bilateral lower lobe sub segmental pulmonary arteries. IMPRESSION: 1. Successful fluoroscopic guided initiation of bilateral ultrasound assisted catheter directed pulmonary arterial lysis for sub massive pulmonary embolism and right-sided heart strain. 2. Markedly elevated pressure measurements within the right main pulmonary artery compatible with  critical pulmonary arterial hypertension. PLAN: - The patient will return to the interventional radiology suite following the initiation of the catheter directed pulmonary arterial lysis, for repeat pressure measurements and either removal of the catheters or continuation of the catheter directed thrombolysis. Electronically Signed   By: Corlis Leak M.D.   On: 09/27/2015 13:29   Ir US Guide Vasc Access Right  Result Date: 09/27/2015 INDICATION: Sub massive bilateral pulmonary emboli superimposed on baseline heart failure. Shortness of breath. Recent inguinal hernia repair surgery. EXAM: 1. ULTRASOUND GUIDANCE FOR VENOUS ACCESS X2 2. BILATERAL PULMONARY ARTERIOGRAPHY 3. FLUOROSCOPIC GUIDED PLACEMENT OF BILATERAL PULMONARY ARTERIAL LYTIC INFUSION CATHETERS COMPARISON:  CT chest 09/26/2015 MEDICATIONS: Intravenous Fentanyl and Versed were administered as conscious sedation during continuous monitoring of the patient's level of consciousness and physiological / cardiorespiratory status by the radiology RN, with a total moderate sedation time of 20 minutes. CONTRAST:  None administered FLUOROSCOPY TIME:  3.2 minutes, 287  uGym2 DAP COMPLICATIONS: None immediate TECHNIQUE: Informed written consent was obtained from the patient after a discussion of the risks, benefits and alternatives to treatment. Questions regarding the procedure were encouraged and answered. A timeout was performed prior to the initiation of the procedure. Ultrasound scanning demonstrated patency of the  right internal jugular vein, selected for vascular access. The right neck was prepped and draped in the usual sterile fashion, and a sterile drape was applied covering the operative field. Maximum barrier sterile technique with sterile gowns and gloves were used for the procedure. A timeout was performed prior to the initiation of the procedure. Local anesthesia was provided with 1% lidocaine. Under direct ultrasound guidance, the right internal  jugular vein was accessed with a micro puncture sheath ultimately allowing placement of a 6 French vascular sheath. Slightly cranial to this initial access, the right internal jugular vein was again accessed with a micropuncture sheath ultimately allowing placement of a 6 French vascular sheath. With the use of a guidewire, an angled pigtail catheter was advanced into the right main pulmonary artery . Pressure measurements were then obtained from the right pulmonary artery. Over a Benson wire, the pigtail catheter was exchanged for a 18 cm multi side-hole EKOS ultrasound assisted infusion catheter. With the use of a guidewire, the angled pigtail catheter was advanced into the left main pulmonary artery . Over a Benson wire, the pigtail catheter was exchanged for a 12 cm multi side-hole EKOS ultrasound assisted infusion catheter. A postprocedural fluoroscopic image was obtained of the check demonstrating final catheter positioning. Both vascular sheath were secured at the right neck with interrupted 0 silk suture. The external catheter tubing was secured and the lytic therapy was initiated. The patient tolerated the procedure well without immediate postprocedural complication. FINDINGS: Acquired pressure measurements: Right  pulmonary artery - 54/19 (mean 28) (normal: < 25/10) Following the procedure, both ultrasound assisted infusion catheter tips terminate within the distal aspects of the bilateral lower lobe sub segmental pulmonary arteries. IMPRESSION: 1. Successful fluoroscopic guided initiation of bilateral ultrasound assisted catheter directed pulmonary arterial lysis for sub massive pulmonary embolism and right-sided heart strain. 2. Markedly elevated pressure measurements within the right main pulmonary artery compatible with critical pulmonary arterial hypertension. PLAN: - The patient will return to the interventional radiology suite following the initiation of the catheter directed pulmonary arterial  lysis, for repeat pressure measurements and either removal of the catheters or continuation of the catheter directed thrombolysis. Electronically Signed   By: Corlis Leak M.D.   On: 09/27/2015 13:29   Mr Card Morphology Wo/w Cm  Result Date: 09/26/2015 CLINICAL DATA:  Cardiomyopathy EXAM: CARDIAC MRI TECHNIQUE: The patient was scanned on a 1.5 Tesla GE magnet. A dedicated cardiac coil was used. Functional imaging was done using Fiesta sequences. 2,3, and 4 chamber views were done to assess for RWMA's. Modified Simpson's rule using a short axis stack was used to calculate an ejection fraction on a dedicated work [employer]. The patient received 28 cc of Multihance. After 10 minutes inversion recovery sequences were used to assess for infiltration and scar tissue. CONTRAST:  28 cc Multihance FINDINGS: The atria where of normal size. There was no apparent ASD/VSD or PFO. The LV had abnormal septal motion likely from LBBB. The quantitative LV EF was 54% (ESV 135 EDV 62 cc SV 73 cc) The RV was severely dilated and severely hypokinetic. Basal diameter 65 mm Mid diameter 48 mm Long Axis:  100 mm The quantitative RV EF was only 13% (EDV 194 cc ESV 170 cc SV 24 cc) IIR and IIIR with fat sat images showed no evidence of RV dysplasia Delayed enhancement images with gadolinium showed no LV myocardial infiltration scar or infarct IMPRESSION: 1) Severe RV enlargement and hypokinesis EF 13% 2) Normal LV size  and function abnormal septal motion EF 54% 3) No evidence of RV dysplasia 4) No delayed enhancement scar or infiltration of LV myocardium 5) No obvious ASD/PFO or VSD Findings discussed with Dr Beckie Busing Electronically Signed   By: Charlton Haws M.D.   On: 09/26/2015 13:38   Ir Infusion Thrombol Arterial Initial (ms)  Result Date: 09/27/2015 INDICATION: Sub massive bilateral pulmonary emboli superimposed on baseline heart failure. Shortness of breath. Recent inguinal hernia repair  surgery. EXAM: 1. ULTRASOUND GUIDANCE FOR VENOUS ACCESS X2 2. BILATERAL PULMONARY ARTERIOGRAPHY 3. FLUOROSCOPIC GUIDED PLACEMENT OF BILATERAL PULMONARY ARTERIAL LYTIC INFUSION CATHETERS COMPARISON:  CT chest 09/26/2015 MEDICATIONS: Intravenous Fentanyl and Versed were administered as conscious sedation during continuous monitoring of the patient's level of consciousness and physiological / cardiorespiratory status by the radiology RN, with a total moderate sedation time of 20 minutes. CONTRAST:  None administered FLUOROSCOPY TIME:  3.2 minutes, 287  uGym2 DAP COMPLICATIONS: None immediate TECHNIQUE: Informed written consent was obtained from the patient after a discussion of the risks, benefits and alternatives to treatment. Questions regarding the procedure were encouraged and answered. A timeout was performed prior to the initiation of the procedure. Ultrasound scanning demonstrated patency of the right internal jugular vein, selected for vascular access. The right neck was prepped and draped in the usual sterile fashion, and a sterile drape was applied covering the operative field. Maximum barrier sterile technique with sterile gowns and gloves were used for the procedure. A timeout was performed prior to the initiation of the procedure. Local anesthesia was provided with 1% lidocaine. Under direct ultrasound guidance, the right internal jugular vein was accessed with a micro puncture sheath ultimately allowing placement of a 6 French vascular sheath. Slightly cranial to this initial access, the right internal jugular vein was again accessed with a micropuncture sheath ultimately allowing placement of a 6 French vascular sheath. With the use of a guidewire, an angled pigtail catheter was advanced into the right main pulmonary artery . Pressure measurements were then obtained from the right pulmonary artery. Over a Benson wire, the pigtail catheter was exchanged for a 18 cm multi side-hole EKOS ultrasound assisted  infusion catheter. With the use of a guidewire, the angled pigtail catheter was advanced into the left main pulmonary artery . Over a Benson wire, the pigtail catheter was exchanged for a 12 cm multi side-hole EKOS ultrasound assisted infusion catheter. A postprocedural fluoroscopic image was obtained of the check demonstrating final catheter positioning. Both vascular sheath were secured at the right neck with interrupted 0 silk suture. The external catheter tubing was secured and the lytic therapy was initiated. The patient tolerated the procedure well without immediate postprocedural complication. FINDINGS: Acquired pressure measurements: Right  pulmonary artery - 54/19 (mean 28) (normal: < 25/10) Following the procedure, both ultrasound assisted infusion catheter tips terminate within the distal aspects of the bilateral lower lobe sub segmental pulmonary arteries. IMPRESSION: 1. Successful fluoroscopic guided initiation of bilateral ultrasound assisted catheter directed pulmonary arterial lysis for sub massive pulmonary embolism and right-sided heart strain. 2. Markedly elevated pressure measurements within the right main pulmonary artery compatible with critical pulmonary arterial hypertension. PLAN: - The patient will return to the interventional radiology suite following the initiation of the catheter directed pulmonary arterial lysis, for repeat pressure measurements and either removal of the catheters or continuation of the catheter directed thrombolysis. Electronically Signed   By: Corlis Leak M.D.   On: 09/27/2015 13:29   Ir Infusion Thrombol Arterial Initial (ms)  Result Date: 09/27/2015 INDICATION: Sub massive bilateral pulmonary emboli superimposed on baseline heart failure. Shortness of breath. Recent inguinal hernia repair surgery. EXAM: 1. ULTRASOUND GUIDANCE FOR VENOUS ACCESS X2 2. BILATERAL PULMONARY ARTERIOGRAPHY 3. FLUOROSCOPIC GUIDED PLACEMENT OF BILATERAL PULMONARY ARTERIAL LYTIC INFUSION  CATHETERS COMPARISON:  CT chest 09/26/2015 MEDICATIONS: Intravenous Fentanyl and Versed were administered as conscious sedation during continuous monitoring of the patient's level of consciousness and physiological / cardiorespiratory status by the radiology RN, with a total moderate sedation time of 20 minutes. CONTRAST:  None administered FLUOROSCOPY TIME:  3.2 minutes, 287  uGym2 DAP COMPLICATIONS: None immediate TECHNIQUE: Informed written consent was obtained from the patient after a discussion of the risks, benefits and alternatives to treatment. Questions regarding the procedure were encouraged and answered. A timeout was performed prior to the initiation of the procedure. Ultrasound scanning demonstrated patency of the right internal jugular vein, selected for vascular access. The right neck was prepped and draped in the usual sterile fashion, and a sterile drape was applied covering the operative field. Maximum barrier sterile technique with sterile gowns and gloves were used for the procedure. A timeout was performed prior to the initiation of the procedure. Local anesthesia was provided with 1% lidocaine. Under direct ultrasound guidance, the right internal jugular vein was accessed with a micro puncture sheath ultimately allowing placement of a 6 French vascular sheath. Slightly cranial to this initial access, the right internal jugular vein was again accessed with a micropuncture sheath ultimately allowing placement of a 6 French vascular sheath. With the use of a guidewire, an angled pigtail catheter was advanced into the right main pulmonary artery . Pressure measurements were then obtained from the right pulmonary artery. Over a Benson wire, the pigtail catheter was exchanged for a 18 cm multi side-hole EKOS ultrasound assisted infusion catheter. With the use of a guidewire, the angled pigtail catheter was advanced into the left main pulmonary artery . Over a Benson wire, the pigtail catheter was  exchanged for a 12 cm multi side-hole EKOS ultrasound assisted infusion catheter. A postprocedural fluoroscopic image was obtained of the check demonstrating final catheter positioning. Both vascular sheath were secured at the right neck with interrupted 0 silk suture. The external catheter tubing was secured and the lytic therapy was initiated. The patient tolerated the procedure well without immediate postprocedural complication. FINDINGS: Acquired pressure measurements: Right  pulmonary artery - 54/19 (mean 28) (normal: < 25/10) Following the procedure, both ultrasound assisted infusion catheter tips terminate within the distal aspects of the bilateral lower lobe sub segmental pulmonary arteries. IMPRESSION: 1. Successful fluoroscopic guided initiation of bilateral ultrasound assisted catheter directed pulmonary arterial lysis for sub massive pulmonary embolism and right-sided heart strain. 2. Markedly elevated pressure measurements within the right main pulmonary artery compatible with critical pulmonary arterial hypertension. PLAN: - The patient will return to the interventional radiology suite following the initiation of the catheter directed pulmonary arterial lysis, for repeat pressure measurements and either removal of the catheters or continuation of the catheter directed thrombolysis. Electronically Signed   By: Corlis Leak M.D.   On: 09/27/2015 13:29    Latest Echo  Latest Cath   Medications:     Scheduled Medications: . potassium chloride  40 mEq Oral Daily  . promethazine  12.5-25 mg Intravenous Once  . sodium chloride flush  3 mL Intravenous Q12H  . sodium chloride flush  3 mL Intravenous Q12H  . sodium chloride flush  3 mL Intravenous Q12H  . sodium chloride flush  3 mL Intravenous Q12H    Infusions: . sodium chloride    . sodium chloride 35 mL/hr (09/28/15 0710)  . sodium chloride 20.8 mL/hr at 09/28/15 0700  . sodium chloride 20.8 mL/hr at 09/28/15 0700  . heparin 1,850  Units/hr (09/28/15 0800)    PRN Medications: sodium chloride, sodium chloride, sodium chloride, acetaminophen, oxyCODONE, promethazine **OR** promethazine **OR** promethazine, sodium chloride flush, sodium chloride flush, sodium chloride flush   Assessment   1. Submassive bilateral PE with RV strain  2. Non-obstructive CAD.  3. Syncope  4. LBBB. LVEF 54%  5. Elevated Troponin  Plan    Has done well with EKOS. IR to remove EKOS catheter today. PA pressures assessed personally and way down.  No significant bleeding evident.   LE u/s has not been done yet to assess LE clot burden. May need to consider temporary IVC filter if burden high. I have contacted [facility] this am and they will do LE u/s this am. I spoke to Dr. Rica Records in IR.  Switch to apixaban after t-pa lysis process complete.   CCM to assume care per Dr. Kendrick Fries. Appreciate their assistance.   Bensimhon, Daniel,MD 9:01 AM

## 2015-09-28 NOTE — Progress Notes (Signed)
ANTICOAGULATION CONSULT NOTE - Follow-up Consult  Pharmacy Consult for heparin Indication: PE  Allergies  Allergen Reactions  . No Known Allergies     Patient Measurements: Height: 6\' 1"  (185.4 cm) Weight: 199 lb 4.7 oz (90.4 kg) IBW/kg (Calculated) : 79.9 Heparin Dosing Weight: 98kg  Vital Signs: Temp: 99.7 F (37.6 C) (09/24 0322) Temp Source: Oral (09/24 0322) BP: 144/103 (09/24 0300) Pulse Rate: 72 (09/24 0300)  Labs:  Recent Labs  09/25/15 0352 09/25/15 0747 09/25/15 1222  09/26/15 0257  09/27/15 0632 09/27/15 1440 09/27/15 2020 09/28/15 0300  HGB  --   --   --   < > 13.2  --  14.0 14.5 13.7 13.2  HCT  --   --   --   < > 41.0  --  41.9 42.6 40.5 39.5  PLT  --   --   --   < > 250  --  271 270 255 261  LABPROT  --   --  16.1*  --   --   --   --   --   --   --   INR  --   --  1.28  --   --   --   --   --   --   --   HEPARINUNFRC  --   --   --   --   --   < >  --  0.52 0.53 0.51  CREATININE  --   --  0.84  --  0.96  --  0.89  --   --   --   TROPONINI 0.20* 0.29* 0.31*  --   --   --   --   --   --   --   < > = values in this interval not displayed.  Estimated Creatinine Clearance: 111 mL/min (by C-G formula based on SCr of 0.89 mg/dL).  Assessment: 50 year old M on heparin for extensive b/l PE with R heart strain. S/p lysis today and continues on IV heparin. Heparin level remains at goal at 0.51. CBC stable wnl, no bleed documented.  Goal of Therapy:  Heparin level 0.3-0.7 units/ml Monitor platelets by anticoagulation protocol: Yes   Plan:  - Continue heparin 1850 units/hr - Continue Q6H heparin level (24h post lytic) - Daily Heparin level/CBC - Monitor for s/sx bleeding  Christoper Fabianaron Leylani Duley, PharmD, BCPS Clinical pharmacist, pager [phone]  09/28/2015 3:45 AM

## 2015-09-28 NOTE — Progress Notes (Signed)
ANTICOAGULATION CONSULT NOTE - Follow-up Consult  Pharmacy Consult for heparin Indication: PE  Allergies  Allergen Reactions  . No Known Allergies     Patient Measurements: Height: 6\' 1"  (185.4 cm) Weight: 199 lb 4.7 oz (90.4 kg) IBW/kg (Calculated) : 79.9 Heparin Dosing Weight: 98kg  Vital Signs: Temp: 98.8 F (37.1 C) (09/24 0744) Temp Source: Oral (09/24 0744) BP: 145/90 (09/24 1100) Pulse Rate: 70 (09/24 1100)  Labs:  Recent Labs  09/25/15 1222  09/26/15 0257  09/27/15 [id number]  09/27/15 2020 09/28/15 0300 09/28/15 0833 09/28/15 0834  HGB  --   < > 13.2  --  14.0  < > 13.7 13.2  --  13.2  HCT  --   < > 41.0  --  41.9  < > 40.5 39.5  --  38.8*  PLT  --   < > 250  --  271  < > 255 261  --  234  LABPROT 16.1*  --   --   --   --   --   --   --   --   --   INR 1.28  --   --   --   --   --   --   --   --   --   HEPARINUNFRC  --   --   --   < >  --   < > 0.53 0.51 0.55  --   CREATININE 0.84  --  0.96  --  0.89  --   --  0.80  --   --   TROPONINI 0.31*  --   --   --   --   --   --   --   --   --   < > = values in this interval not displayed.  Estimated Creatinine Clearance: 123.5 mL/min (by C-G formula based on SCr of 0.8 mg/dL).  Assessment: 50 year old M on heparin for extensive b/l PE with R heart strain. S/p lysis 9/23 and continues on IV heparin. Heparin level remains at goal at 0.55. CBC stable wnl, no bleed documented.  Goal of Therapy:  Heparin level 0.3-0.7 units/ml Monitor platelets by anticoagulation protocol: Yes   Plan:  - Continue heparin 1850 units/hr - Daily heparin level and CBC - F/u plans for oral anticoagulation  Lysle Pearlachel Adrielle Polakowski, PharmD, BCPS Pager # [phone] 09/28/2015 11:23 AM

## 2015-09-28 NOTE — Progress Notes (Signed)
PULMONARY / CRITICAL CARE MEDICINE   Name: Wayne Bolton MRN: [id number] DOB: 1965/11/06    ADMISSION DATE:  09/24/2015 CONSULTATION DATE:  09/27/15  REFERRING MD:  Dr. Gala Romney   CHIEF COMPLAINT:  PE   BRIEF: 50 y/o male with submassive PE post bilateral inguinal hernia repair 9/18.  Received [facility] 9/23.   SUBJECTIVE: Feels OK, no bleeding   VITAL SIGNS: BP (!) 146/97   Pulse 80   Temp 98.8 F (37.1 C) (Oral)   Resp (!) 22   Ht 6\' 1"  (1.854 m)   Wt 90.4 kg (199 lb 4.7 oz)   SpO2 96%   BMI 26.29 kg/m   HEMODYNAMICS:    VENTILATOR SETTINGS:    INTAKE / OUTPUT: I/O last 3 completed shifts: In: 2486.5 [P.O.:480; I.V.:2006.5] Out: 2625 [Urine:2625]  PHYSICAL EXAMINATION: General:  Lying comfortably in bed Neuro:  Awake, alert, no distress HEENT:  NCAT OP clear Cardiovascular:  RRR, no mgr Lungs:  CTA B, normal effort Abdomen:  Belly soft, nontender, abdominal wounds well dressed, no bleeding Musculoskeletal:  Normal bulk and tone Skin:  No rash or skin breakdown  LABS:  BMET  Recent Labs Lab 09/26/15 0257 09/27/15 0632 09/28/15 0300  NA 136 137 135  K 3.8 3.5 3.7  CL 101 100* 104  CO2 25 23 23   BUN 9 15 15   CREATININE 0.96 0.89 0.80  GLUCOSE 93 102* 111*    Electrolytes  Recent Labs Lab 09/25/15 1222 09/26/15 0257 09/27/15 0632 09/28/15 0300  CALCIUM 8.7* 8.7* 9.3 8.6*  MG 1.9  --   --   --     CBC  Recent Labs Lab 09/27/15 2020 09/28/15 0300 09/28/15 0834  WBC 13.8* 13.0* 12.4*  HGB 13.7 13.2 13.2  HCT 40.5 39.5 38.8*  PLT 255 261 234    Coag's  Recent Labs Lab 09/25/15 1222  INR 1.28    Sepsis Markers No results for input(s): LATICACIDVEN, PROCALCITON, O2SATVEN in the last 168 hours.  ABG  Recent Labs Lab 09/25/15 1437  PHART 7.433  PCO2ART 32.1  PO2ART 58.0*    Liver Enzymes  Recent Labs Lab 09/26/15 0257  AST 57*  ALT 83*  ALKPHOS 42  BILITOT 1.0  ALBUMIN 3.2*    Cardiac Enzymes  Recent  Labs Lab 09/25/15 0352 09/25/15 0747 09/25/15 1222  TROPONINI 0.20* 0.29* 0.31*    Glucose  Recent Labs Lab 09/24/15 2338 09/25/15 0619 09/26/15 0549 09/27/15 0536 09/27/15 1439 09/28/15 0311  GLUCAP 133* 108* 101* 106* 102* 109*    Imaging Ir Angiogram Pulmonary Bilateral Selective  Result Date: 09/27/2015 INDICATION: Sub massive bilateral pulmonary emboli superimposed on baseline heart failure. Shortness of breath. Recent inguinal hernia repair surgery. EXAM: 1. ULTRASOUND GUIDANCE FOR VENOUS ACCESS X2 2. BILATERAL PULMONARY ARTERIOGRAPHY 3. FLUOROSCOPIC GUIDED PLACEMENT OF BILATERAL PULMONARY ARTERIAL LYTIC INFUSION CATHETERS COMPARISON:  CT chest 09/26/2015 MEDICATIONS: Intravenous Fentanyl and Versed were administered as conscious sedation during continuous monitoring of the patient's level of consciousness and physiological / cardiorespiratory status by the radiology RN, with a total moderate sedation time of 20 minutes. CONTRAST:  None administered FLUOROSCOPY TIME:  3.2 minutes, 287  uGym2 DAP COMPLICATIONS: None immediate TECHNIQUE: Informed written consent was obtained from the patient after a discussion of the risks, benefits and alternatives to treatment. Questions regarding the procedure were encouraged and answered. A timeout was performed prior to the initiation of the procedure. Ultrasound scanning demonstrated patency of the right internal jugular vein, selected for vascular access. The  right neck was prepped and draped in the usual sterile fashion, and a sterile drape was applied covering the operative field. Maximum barrier sterile technique with sterile gowns and gloves were used for the procedure. A timeout was performed prior to the initiation of the procedure. Local anesthesia was provided with 1% lidocaine. Under direct ultrasound guidance, the right internal jugular vein was accessed with a micro puncture sheath ultimately allowing placement of a 6 French vascular  sheath. Slightly cranial to this initial access, the right internal jugular vein was again accessed with a micropuncture sheath ultimately allowing placement of a 6 French vascular sheath. With the use of a guidewire, an angled pigtail catheter was advanced into the right main pulmonary artery . Pressure measurements were then obtained from the right pulmonary artery. Over a Benson wire, the pigtail catheter was exchanged for a 18 cm multi side-hole [facility] ultrasound assisted infusion catheter. With the use of a guidewire, the angled pigtail catheter was advanced into the left main pulmonary artery . Over a Benson wire, the pigtail catheter was exchanged for a 12 cm multi side-hole [facility] ultrasound assisted infusion catheter. A postprocedural fluoroscopic image was obtained of the check demonstrating final catheter positioning. Both vascular sheath were secured at the right neck with interrupted 0 silk suture. The external catheter tubing was secured and the lytic therapy was initiated. The patient tolerated the procedure well without immediate postprocedural complication. FINDINGS: Acquired pressure measurements: Right  pulmonary artery - 54/19 (mean 28) (normal: < 25/10) Following the procedure, both ultrasound assisted infusion catheter tips terminate within the distal aspects of the bilateral lower lobe sub segmental pulmonary arteries. IMPRESSION: 1. Successful fluoroscopic guided initiation of bilateral ultrasound assisted catheter directed pulmonary arterial lysis for sub massive pulmonary embolism and right-sided heart strain. 2. Markedly elevated pressure measurements within the right main pulmonary artery compatible with critical pulmonary arterial hypertension. PLAN: - The patient will return to the interventional radiology suite following the initiation of the catheter directed pulmonary arterial lysis, for repeat pressure measurements and either removal of the catheters or continuation of the catheter  directed thrombolysis. Electronically Signed   By: Corlis Leak M.D.   On: 09/27/2015 13:29   Ir Angiogram Selective Each Additional Vessel  Result Date: 09/27/2015 INDICATION: Sub massive bilateral pulmonary emboli superimposed on baseline heart failure. Shortness of breath. Recent inguinal hernia repair surgery. EXAM: 1. ULTRASOUND GUIDANCE FOR VENOUS ACCESS X2 2. BILATERAL PULMONARY ARTERIOGRAPHY 3. FLUOROSCOPIC GUIDED PLACEMENT OF BILATERAL PULMONARY ARTERIAL LYTIC INFUSION CATHETERS COMPARISON:  CT chest 09/26/2015 MEDICATIONS: Intravenous Fentanyl and Versed were administered as conscious sedation during continuous monitoring of the patient's level of consciousness and physiological / cardiorespiratory status by the radiology RN, with a total moderate sedation time of 20 minutes. CONTRAST:  None administered FLUOROSCOPY TIME:  3.2 minutes, 287  uGym2 DAP COMPLICATIONS: None immediate TECHNIQUE: Informed written consent was obtained from the patient after a discussion of the risks, benefits and alternatives to treatment. Questions regarding the procedure were encouraged and answered. A timeout was performed prior to the initiation of the procedure. Ultrasound scanning demonstrated patency of the right internal jugular vein, selected for vascular access. The right neck was prepped and draped in the usual sterile fashion, and a sterile drape was applied covering the operative field. Maximum barrier sterile technique with sterile gowns and gloves were used for the procedure. A timeout was performed prior to the initiation of the procedure. Local anesthesia was provided with 1% lidocaine. Under direct ultrasound guidance, the right  internal jugular vein was accessed with a micro puncture sheath ultimately allowing placement of a 6 French vascular sheath. Slightly cranial to this initial access, the right internal jugular vein was again accessed with a micropuncture sheath ultimately allowing placement of a 6  French vascular sheath. With the use of a guidewire, an angled pigtail catheter was advanced into the right main pulmonary artery . Pressure measurements were then obtained from the right pulmonary artery. Over a Benson wire, the pigtail catheter was exchanged for a 18 cm multi side-hole [facility] ultrasound assisted infusion catheter. With the use of a guidewire, the angled pigtail catheter was advanced into the left main pulmonary artery . Over a Benson wire, the pigtail catheter was exchanged for a 12 cm multi side-hole [facility] ultrasound assisted infusion catheter. A postprocedural fluoroscopic image was obtained of the check demonstrating final catheter positioning. Both vascular sheath were secured at the right neck with interrupted 0 silk suture. The external catheter tubing was secured and the lytic therapy was initiated. The patient tolerated the procedure well without immediate postprocedural complication. FINDINGS: Acquired pressure measurements: Right  pulmonary artery - 54/19 (mean 28) (normal: < 25/10) Following the procedure, both ultrasound assisted infusion catheter tips terminate within the distal aspects of the bilateral lower lobe sub segmental pulmonary arteries. IMPRESSION: 1. Successful fluoroscopic guided initiation of bilateral ultrasound assisted catheter directed pulmonary arterial lysis for sub massive pulmonary embolism and right-sided heart strain. 2. Markedly elevated pressure measurements within the right main pulmonary artery compatible with critical pulmonary arterial hypertension. PLAN: - The patient will return to the interventional radiology suite following the initiation of the catheter directed pulmonary arterial lysis, for repeat pressure measurements and either removal of the catheters or continuation of the catheter directed thrombolysis. Electronically Signed   By: Corlis Leak  Hassell M.D.   On: 09/27/2015 13:29   Ir Angiogram Selective Each Additional Vessel  Result Date:  09/27/2015 INDICATION: Sub massive bilateral pulmonary emboli superimposed on baseline heart failure. Shortness of breath. Recent inguinal hernia repair surgery. EXAM: 1. ULTRASOUND GUIDANCE FOR VENOUS ACCESS X2 2. BILATERAL PULMONARY ARTERIOGRAPHY 3. FLUOROSCOPIC GUIDED PLACEMENT OF BILATERAL PULMONARY ARTERIAL LYTIC INFUSION CATHETERS COMPARISON:  CT chest 09/26/2015 MEDICATIONS: Intravenous Fentanyl and Versed were administered as conscious sedation during continuous monitoring of the patient's level of consciousness and physiological / cardiorespiratory status by the radiology RN, with a total moderate sedation time of 20 minutes. CONTRAST:  None administered FLUOROSCOPY TIME:  3.2 minutes, 287  uGym2 DAP COMPLICATIONS: None immediate TECHNIQUE: Informed written consent was obtained from the patient after a discussion of the risks, benefits and alternatives to treatment. Questions regarding the procedure were encouraged and answered. A timeout was performed prior to the initiation of the procedure. Ultrasound scanning demonstrated patency of the right internal jugular vein, selected for vascular access. The right neck was prepped and draped in the usual sterile fashion, and a sterile drape was applied covering the operative field. Maximum barrier sterile technique with sterile gowns and gloves were used for the procedure. A timeout was performed prior to the initiation of the procedure. Local anesthesia was provided with 1% lidocaine. Under direct ultrasound guidance, the right internal jugular vein was accessed with a micro puncture sheath ultimately allowing placement of a 6 French vascular sheath. Slightly cranial to this initial access, the right internal jugular vein was again accessed with a micropuncture sheath ultimately allowing placement of a 6 French vascular sheath. With the use of a guidewire, an angled pigtail catheter was advanced into  the right main pulmonary artery . Pressure measurements were  then obtained from the right pulmonary artery. Over a Benson wire, the pigtail catheter was exchanged for a 18 cm multi side-hole [facility] ultrasound assisted infusion catheter. With the use of a guidewire, the angled pigtail catheter was advanced into the left main pulmonary artery . Over a Benson wire, the pigtail catheter was exchanged for a 12 cm multi side-hole [facility] ultrasound assisted infusion catheter. A postprocedural fluoroscopic image was obtained of the check demonstrating final catheter positioning. Both vascular sheath were secured at the right neck with interrupted 0 silk suture. The external catheter tubing was secured and the lytic therapy was initiated. The patient tolerated the procedure well without immediate postprocedural complication. FINDINGS: Acquired pressure measurements: Right  pulmonary artery - 54/19 (mean 28) (normal: < 25/10) Following the procedure, both ultrasound assisted infusion catheter tips terminate within the distal aspects of the bilateral lower lobe sub segmental pulmonary arteries. IMPRESSION: 1. Successful fluoroscopic guided initiation of bilateral ultrasound assisted catheter directed pulmonary arterial lysis for sub massive pulmonary embolism and right-sided heart strain. 2. Markedly elevated pressure measurements within the right main pulmonary artery compatible with critical pulmonary arterial hypertension. PLAN: - The patient will return to the interventional radiology suite following the initiation of the catheter directed pulmonary arterial lysis, for repeat pressure measurements and either removal of the catheters or continuation of the catheter directed thrombolysis. Electronically Signed   By: Corlis Leak M.D.   On: 09/27/2015 13:29   Ir US Guide Vasc Access Right  Result Date: 09/27/2015 INDICATION: Sub massive bilateral pulmonary emboli superimposed on baseline heart failure. Shortness of breath. Recent inguinal hernia repair surgery. EXAM: 1. ULTRASOUND  GUIDANCE FOR VENOUS ACCESS X2 2. BILATERAL PULMONARY ARTERIOGRAPHY 3. FLUOROSCOPIC GUIDED PLACEMENT OF BILATERAL PULMONARY ARTERIAL LYTIC INFUSION CATHETERS COMPARISON:  CT chest 09/26/2015 MEDICATIONS: Intravenous Fentanyl and Versed were administered as conscious sedation during continuous monitoring of the patient's level of consciousness and physiological / cardiorespiratory status by the radiology RN, with a total moderate sedation time of 20 minutes. CONTRAST:  None administered FLUOROSCOPY TIME:  3.2 minutes, 287  uGym2 DAP COMPLICATIONS: None immediate TECHNIQUE: Informed written consent was obtained from the patient after a discussion of the risks, benefits and alternatives to treatment. Questions regarding the procedure were encouraged and answered. A timeout was performed prior to the initiation of the procedure. Ultrasound scanning demonstrated patency of the right internal jugular vein, selected for vascular access. The right neck was prepped and draped in the usual sterile fashion, and a sterile drape was applied covering the operative field. Maximum barrier sterile technique with sterile gowns and gloves were used for the procedure. A timeout was performed prior to the initiation of the procedure. Local anesthesia was provided with 1% lidocaine. Under direct ultrasound guidance, the right internal jugular vein was accessed with a micro puncture sheath ultimately allowing placement of a 6 French vascular sheath. Slightly cranial to this initial access, the right internal jugular vein was again accessed with a micropuncture sheath ultimately allowing placement of a 6 French vascular sheath. With the use of a guidewire, an angled pigtail catheter was advanced into the right main pulmonary artery . Pressure measurements were then obtained from the right pulmonary artery. Over a Benson wire, the pigtail catheter was exchanged for a 18 cm multi side-hole [facility] ultrasound assisted infusion catheter. With the  use of a guidewire, the angled pigtail catheter was advanced into the left main pulmonary artery . Over a [place] D[place]  wire, the pigtail catheter was exchanged for a 12 cm multi side-hole [facility] ultrasound assisted infusion catheter. A postprocedural fluoroscopic image was obtained of the check demonstrating final catheter positioning. Both vascular sheath were secured at the right neck with interrupted 0 silk suture. The external catheter tubing was secured and the lytic therapy was initiated. The patient tolerated the procedure well without immediate postprocedural complication. FINDINGS: Acquired pressure measurements: Right  pulmonary artery - 54/19 (mean 28) (normal: < 25/10) Following the procedure, both ultrasound assisted infusion catheter tips terminate within the distal aspects of the bilateral lower lobe sub segmental pulmonary arteries. IMPRESSION: 1. Successful fluoroscopic guided initiation of bilateral ultrasound assisted catheter directed pulmonary arterial lysis for sub massive pulmonary embolism and right-sided heart strain. 2. Markedly elevated pressure measurements within the right main pulmonary artery compatible with critical pulmonary arterial hypertension. PLAN: - The patient will return to the interventional radiology suite following the initiation of the catheter directed pulmonary arterial lysis, for repeat pressure measurements and either removal of the catheters or continuation of the catheter directed thrombolysis. Electronically Signed   By: Corlis Leak M.D.   On: 09/27/2015 13:29   Ir Infusion Thrombol Arterial Initial (ms)  Result Date: 09/27/2015 INDICATION: Sub massive bilateral pulmonary emboli superimposed on baseline heart failure. Shortness of breath. Recent inguinal hernia repair surgery. EXAM: 1. ULTRASOUND GUIDANCE FOR VENOUS ACCESS X2 2. BILATERAL PULMONARY ARTERIOGRAPHY 3. FLUOROSCOPIC GUIDED PLACEMENT OF BILATERAL PULMONARY ARTERIAL LYTIC INFUSION CATHETERS COMPARISON:  CT  chest 09/26/2015 MEDICATIONS: Intravenous Fentanyl and Versed were administered as conscious sedation during continuous monitoring of the patient's level of consciousness and physiological / cardiorespiratory status by the radiology RN, with a total moderate sedation time of 20 minutes. CONTRAST:  None administered FLUOROSCOPY TIME:  3.2 minutes, 287  uGym2 DAP COMPLICATIONS: None immediate TECHNIQUE: Informed written consent was obtained from the patient after a discussion of the risks, benefits and alternatives to treatment. Questions regarding the procedure were encouraged and answered. A timeout was performed prior to the initiation of the procedure. Ultrasound scanning demonstrated patency of the right internal jugular vein, selected for vascular access. The right neck was prepped and draped in the usual sterile fashion, and a sterile drape was applied covering the operative field. Maximum barrier sterile technique with sterile gowns and gloves were used for the procedure. A timeout was performed prior to the initiation of the procedure. Local anesthesia was provided with 1% lidocaine. Under direct ultrasound guidance, the right internal jugular vein was accessed with a micro puncture sheath ultimately allowing placement of a 6 French vascular sheath. Slightly cranial to this initial access, the right internal jugular vein was again accessed with a micropuncture sheath ultimately allowing placement of a 6 French vascular sheath. With the use of a guidewire, an angled pigtail catheter was advanced into the right main pulmonary artery . Pressure measurements were then obtained from the right pulmonary artery. Over a Benson wire, the pigtail catheter was exchanged for a 18 cm multi side-hole [facility] ultrasound assisted infusion catheter. With the use of a guidewire, the angled pigtail catheter was advanced into the left main pulmonary artery . Over a Benson wire, the pigtail catheter was exchanged for a 12 cm multi  side-hole [facility] ultrasound assisted infusion catheter. A postprocedural fluoroscopic image was obtained of the check demonstrating final catheter positioning. Both vascular sheath were secured at the right neck with interrupted 0 silk suture. The external catheter tubing was secured and the lytic therapy was initiated. The patient tolerated  the procedure well without immediate postprocedural complication. FINDINGS: Acquired pressure measurements: Right  pulmonary artery - 54/19 (mean 28) (normal: < 25/10) Following the procedure, both ultrasound assisted infusion catheter tips terminate within the distal aspects of the bilateral lower lobe sub segmental pulmonary arteries. IMPRESSION: 1. Successful fluoroscopic guided initiation of bilateral ultrasound assisted catheter directed pulmonary arterial lysis for sub massive pulmonary embolism and right-sided heart strain. 2. Markedly elevated pressure measurements within the right main pulmonary artery compatible with critical pulmonary arterial hypertension. PLAN: - The patient will return to the interventional radiology suite following the initiation of the catheter directed pulmonary arterial lysis, for repeat pressure measurements and either removal of the catheters or continuation of the catheter directed thrombolysis. Electronically Signed   By: Corlis Leak M.D.   On: 09/27/2015 13:29   Ir Infusion Thrombol Arterial Initial (ms)  Result Date: 09/27/2015 INDICATION: Sub massive bilateral pulmonary emboli superimposed on baseline heart failure. Shortness of breath. Recent inguinal hernia repair surgery. EXAM: 1. ULTRASOUND GUIDANCE FOR VENOUS ACCESS X2 2. BILATERAL PULMONARY ARTERIOGRAPHY 3. FLUOROSCOPIC GUIDED PLACEMENT OF BILATERAL PULMONARY ARTERIAL LYTIC INFUSION CATHETERS COMPARISON:  CT chest 09/26/2015 MEDICATIONS: Intravenous Fentanyl and Versed were administered as conscious sedation during continuous monitoring of the patient's level of consciousness and  physiological / cardiorespiratory status by the radiology RN, with a total moderate sedation time of 20 minutes. CONTRAST:  None administered FLUOROSCOPY TIME:  3.2 minutes, 287  uGym2 DAP COMPLICATIONS: None immediate TECHNIQUE: Informed written consent was obtained from the patient after a discussion of the risks, benefits and alternatives to treatment. Questions regarding the procedure were encouraged and answered. A timeout was performed prior to the initiation of the procedure. Ultrasound scanning demonstrated patency of the right internal jugular vein, selected for vascular access. The right neck was prepped and draped in the usual sterile fashion, and a sterile drape was applied covering the operative field. Maximum barrier sterile technique with sterile gowns and gloves were used for the procedure. A timeout was performed prior to the initiation of the procedure. Local anesthesia was provided with 1% lidocaine. Under direct ultrasound guidance, the right internal jugular vein was accessed with a micro puncture sheath ultimately allowing placement of a 6 French vascular sheath. Slightly cranial to this initial access, the right internal jugular vein was again accessed with a micropuncture sheath ultimately allowing placement of a 6 French vascular sheath. With the use of a guidewire, an angled pigtail catheter was advanced into the right main pulmonary artery . Pressure measurements were then obtained from the right pulmonary artery. Over a Benson wire, the pigtail catheter was exchanged for a 18 cm multi side-hole [facility] ultrasound assisted infusion catheter. With the use of a guidewire, the angled pigtail catheter was advanced into the left main pulmonary artery . Over a Benson wire, the pigtail catheter was exchanged for a 12 cm multi side-hole [facility] ultrasound assisted infusion catheter. A postprocedural fluoroscopic image was obtained of the check demonstrating final catheter positioning. Both vascular  sheath were secured at the right neck with interrupted 0 silk suture. The external catheter tubing was secured and the lytic therapy was initiated. The patient tolerated the procedure well without immediate postprocedural complication. FINDINGS: Acquired pressure measurements: Right  pulmonary artery - 54/19 (mean 28) (normal: < 25/10) Following the procedure, both ultrasound assisted infusion catheter tips terminate within the distal aspects of the bilateral lower lobe sub segmental pulmonary arteries. IMPRESSION: 1. Successful fluoroscopic guided initiation of bilateral ultrasound assisted catheter directed pulmonary arterial  lysis for sub massive pulmonary embolism and right-sided heart strain. 2. Markedly elevated pressure measurements within the right main pulmonary artery compatible with critical pulmonary arterial hypertension. PLAN: - The patient will return to the interventional radiology suite following the initiation of the catheter directed pulmonary arterial lysis, for repeat pressure measurements and either removal of the catheters or continuation of the catheter directed thrombolysis. Electronically Signed   By: Corlis Leak M.D.   On: 09/27/2015 13:29     STUDIES:  ECHO 9/21 >> LVEF 30-35%, diffuse hypokinesis, grade 2 diastolic dysfunction, LBBB, mild AR, PA peak 35.   R/LHC on 9/21 >> minimal non-obstructive CAD, significantly elevated L/R heart filling pressures and moderate pulmonary hypertension (RA mean 20, wedge 26) Cardiac MRI 9/22 >> severe RV enlargement, hypokinesis EF 13%, normal LV size/function, no ASD/PFO or VSD. CTA Chest 9/22 >> acute PE with evidence of right heart strain (RV/LV ratio 1.2).   LE Doppler 9/24 >>  CULTURES:   ANTIBIOTICS:   SIGNIFICANT EVENTS: 9/18  Inguinal hernia repair with mesh as outpatient, episodes of pre-syncope evaluated by Cardiology  9/20  Admit with sycope  9/23 EKOS   LINES/TUBES:   DISCUSSION: 50 y/o M with recent hernia repair  admitted with submassive PE.    ASSESSMENT / PLAN:  PULMONARY A: Acute PE - submassive, RV/LV ratio 1.2, HD stable, provoked P:   ICU monitoring today F/U LE dopplers today, if large or mobile clot consider retrievable IVC filter, discussed with patient O2 for sats > 92% Appreciate IR assistance with catheter directed lysis Continue heparin infusion Change to oral anticoagulation 9/25 Will need 6 months of anticoagulation Check for Factor VL and prothrombin gene mutation  CARDIOVASCULAR A:  Syncope - in setting of PE  Elevated Troponin  LBBB    Non-obstructive CAD  P:  Cardiology following Hemodynamic monitoring in ICU   RENAL A:   No acute issues  P:   Trend BMP  Replace electrolytes as indicated   GASTROINTESTINAL A:   Recent Hernia Repair - 9/18 with mesh insertion  Nausea  P:   Diet as tolerated  Monitor for bleeding  PRN zofran for nausea  HEMATOLOGIC A:   At Risk Bleeding / Anemia  P:  Trend CBC  Monitor for bleeding   INFECTIOUS A:   No acute infectious process  P:   Monitor fever curve / WBC   NEUROLOGIC A:   Post-Operative Pain  P:   RASS goal: n/a PRN oxycodone   FAMILY  - Updates: wife updated bedside 09/28/2015  - Inter-disciplinary family meet or Palliative Care meeting due by:  day 7  Heber [place], MD [facility] Pager: [phone] Cell: [phone] After 3pm or if no response, call [phone]  09/28/2015, 10:08 AM

## 2015-09-28 NOTE — Progress Notes (Signed)
VASCULAR LAB PRELIMINARY  PRELIMINARY  PRELIMINARY  PRELIMINARY  Bilateral lower extremity venous duplex completed.    Preliminary report:  There is acute DVT noted in the right peroneal and soleal veins and acute DVT noted in the left gastrocnemius vein.   Gave report to Smith RiverMaria, RN  [facility] ServicesKANADY, Wayne [place], RVT 09/28/2015, 11:35 AM

## 2015-09-28 NOTE — Progress Notes (Signed)
Preliminary results of venous US reported to MD

## 2015-09-28 NOTE — Progress Notes (Signed)
Report was given to upcoming nurse. Pt transported to 3E11. Belongings and family are with patient. VS stable, denies pain, new dressing (post line removal) looks dry, clean and intact.

## 2015-09-29 ENCOUNTER — Other Ambulatory Visit: Payer: Self-pay | Admitting: Physician Assistant

## 2015-09-29 DIAGNOSIS — I2692 Saddle embolus of pulmonary artery without acute cor pulmonale: Secondary | ICD-10-CM

## 2015-09-29 DIAGNOSIS — R0902 Hypoxemia: Secondary | ICD-10-CM | POA: Diagnosis present

## 2015-09-29 DIAGNOSIS — I2609 Other pulmonary embolism with acute cor pulmonale: Secondary | ICD-10-CM

## 2015-09-29 DIAGNOSIS — I824Y3 Acute embolism and thrombosis of unspecified deep veins of proximal lower extremity, bilateral: Secondary | ICD-10-CM

## 2015-09-29 DIAGNOSIS — I82403 Acute embolism and thrombosis of unspecified deep veins of lower extremity, bilateral: Secondary | ICD-10-CM

## 2015-09-29 LAB — CBC
HEMATOCRIT: 41 % (ref 39.0–52.0)
HEMOGLOBIN: 13.6 g/dL (ref 13.0–17.0)
MCH: 30.4 pg (ref 26.0–34.0)
MCHC: 33.2 g/dL (ref 30.0–36.0)
MCV: 91.7 fL (ref 78.0–100.0)
Platelets: 281 10*3/uL (ref 150–400)
RBC: 4.47 MIL/uL (ref 4.22–5.81)
RDW: 12.7 % (ref 11.5–15.5)
WBC: 11.5 10*3/uL — ABNORMAL HIGH (ref 4.0–10.5)

## 2015-09-29 LAB — HEPARIN LEVEL (UNFRACTIONATED): Heparin Unfractionated: 0.5 IU/mL (ref 0.30–0.70)

## 2015-09-29 LAB — BASIC METABOLIC PANEL
Anion gap: 8 (ref 5–15)
BUN: 14 mg/dL (ref 6–20)
CHLORIDE: 103 mmol/L (ref 101–111)
CO2: 23 mmol/L (ref 22–32)
Calcium: 8.9 mg/dL (ref 8.9–10.3)
Creatinine, Ser: 0.77 mg/dL (ref 0.61–1.24)
GFR calc Af Amer: >60 mL/min (ref >60)
GFR calc non Af Amer: >60 mL/min (ref >60)
GLUCOSE: 100 mg/dL — AB (ref 65–99)
POTASSIUM: 3.7 mmol/L (ref 3.5–5.1)
Sodium: 134 mmol/L — ABNORMAL LOW (ref 135–145)

## 2015-09-29 LAB — ANTI-SCLERODERMA ANTIBODY

## 2015-09-29 MED ORDER — APIXABAN 5 MG PO TABS: 5.0000 mg | ORAL_TABLET | Freq: Two times a day (BID) | ORAL | Status: DC

## 2015-09-29 MED ORDER — APIXABAN 5 MG PO TABS
10.0000 mg | ORAL_TABLET | Freq: Two times a day (BID) | ORAL | Status: DC
  Administered 2015-09-29 – 2015-09-30 (×3): 10 mg via ORAL
  Filled 2015-09-29 (×3): qty 2

## 2015-09-29 NOTE — Progress Notes (Signed)
Advanced Heart Failure Rounding Note  PCP: None Primary Cardiologist: Dr. [name]  Subjective:    Admitted 09/25/15 after syncopal episode. Was recently admitted for Inguinal hernia repair on 9/18, had syncope after surgery. LBBB was noted, felt to be vaso-vagal. Sent home with plans for Echo and Stress as outpatient.  Pt had recurrence and thus re-presented to ED.  Had + Orthost[facility].     Echo positive for LV dysfunction + mild RV dysfunction. L/RHC with non obstructive CAD, elevated filling pressures and depressed cardiac output. HF team consulted to further evaluate LV/RV dysfunction.  Echo 09/25/15 LVEF 30-35%, Grade 2 DD, LBBB with septal motion abnormality, mild AI, RV mildly dilated and reduced, Mild RAE, Mod TR, PA peak pressure 35 mm Hg.  R/LHC 09/25/15 Fick CO 3.85 Fick CI 1.73 RA mean 20 RV EDP 24 PA 56/27 (37) PW 32/24 (26)  Also showed minimal, non-obstructive CAD, with 30% stenosis at the ostium of the first diagonal branch.  cMRI LVEF 54% severe RV dysfunction  Chest CT 09/26/15: submassive bilateral PE with RV strain  Started on EKOS on 9/23. PA pressures improved PAs 54->32. Catheters now out. LE u/s with bilateral DVT   Feels much better. BP up. Breathing better. No bleeding evident. Hgb stable. CCM managing anticoagulation.    Objective:   Weight Range: 89.9 kg (198 lb 3.2 oz) Body mass index is 26.15 kg/m.   Vital Signs:   Temp:  [98.3 F (36.8 C)-100 F (37.8 C)] 98.3 F (36.8 C) (09/25 0600) Pulse Rate:  [68-84] 70 (09/25 0600) Resp:  [16-28] 16 (09/25 0600) BP: (117-153)/(73-102) 131/80 (09/25 0600) SpO2:  [96 %-100 %] 97 % (09/25 0600) Weight:  [89.9 kg (198 lb 3.2 oz)-91.8 kg (202 lb 6.4 oz)] 89.9 kg (198 lb 3.2 oz) (09/25 0600) Last BM Date: 09/22/15  Weight change: Filed Weights   09/28/15 0200 09/28/15 1748 09/29/15 0600  Weight: 90.4 kg (199 lb 4.7 oz) 91.8 kg (202 lb 6.4 oz) 89.9 kg (198 lb 3.2 oz)    Intake/Output:   Intake/Output  Summary (Last 24 hours) at 09/29/15 0635 Last data filed at 09/29/15 0600  Gross per 24 hour  Intake           931.33 ml  Output             1275 ml  Net          -343.67 ml     Physical Exam:  General:  Well appearing. No resp difficulty HEENT: normal Neck: supple. RIJ  site ok. Carotids 2+ bilat; no bruits. No lymphadenopathy or thyromegaly appreciated. Cor: PMI nondisplaced. Regular rate & rhythm. No rubs, gallops or murmurs. Lungs: CTAB, normal effort Abdomen: soft, NT, ND, no HSM. No bruits or masses. +BS  Extremities: no cyanosis, clubbing, rash, RLE swollen Neuro: alert & orientedx3, cranial nerves grossly intact. moves all 4 extremities w/o difficulty. Affect pleasant  Telemetry:  NSR  Labs: CBC  Recent Labs  09/27/15 0632  09/28/15 0834 09/29/15 0603  WBC 11.6*  < > 12.4* 11.5*  NEUTROABS 7.8*  --   --   --   HGB 14.0  < > 13.2 13.6  HCT 41.9  < > 38.8* 41.0  MCV 91.9  < > 90.7 91.7  PLT 271  < > 234 281  < > = values in this interval not displayed. Basic Metabolic Panel  Recent Labs  09/28/15 0300 09/29/15 0603  NA 135 134*  K 3.7 3.7  CL  104 103  CO2 23 23  GLUCOSE 111* 100*  BUN 15 14  CREATININE 0.80 0.77  CALCIUM 8.6* 8.9   Liver Function Tests No results for input(s): AST, ALT, ALKPHOS, BILITOT, PROT, ALBUMIN in the last 72 hours. No results for input(s): LIPASE, AMYLASE in the last 72 hours. Cardiac Enzymes No results for input(s): CKTOTAL, CKMB, CKMBINDEX, TROPONINI in the last 72 hours.  BNP: BNP (last 3 results)  Recent Labs  09/26/15 0257  BNP 284.2*    ProBNP (last 3 results) No results for input(s): PROBNP in the last 8760 hours.   D-Dimer  Recent Labs  09/26/15 1550  DDIMER 11.22*   Hemoglobin A1C No results for input(s): HGBA1C in the last 72 hours. Fasting Lipid Panel No results for input(s): CHOL, HDL, LDLCALC, TRIG, CHOLHDL, LDLDIRECT in the last 72 hours. Thyroid Function Tests  Recent Labs  09/26/15 0955    T3FREE 2.4    Other results:     Imaging/Studies:  Ir Angiogram Pulmonary Bilateral Selective  Result Date: 09/27/2015 INDICATION: Sub massive bilateral pulmonary emboli superimposed on baseline heart failure. Shortness of breath. Recent inguinal hernia repair surgery. EXAM: 1. ULTRASOUND GUIDANCE FOR VENOUS ACCESS X2 2. BILATERAL PULMONARY ARTERIOGRAPHY 3. FLUOROSCOPIC GUIDED PLACEMENT OF BILATERAL PULMONARY ARTERIAL LYTIC INFUSION CATHETERS COMPARISON:  CT chest 09/26/2015 MEDICATIONS: Intravenous Fentanyl and Versed were administered as conscious sedation during continuous monitoring of the patient's level of consciousness and physiological / cardiorespiratory status by the radiology RN, with a total moderate sedation time of 20 minutes. CONTRAST:  None administered FLUOROSCOPY TIME:  3.2 minutes, 287  uGym2 DAP COMPLICATIONS: None immediate TECHNIQUE: Informed written consent was obtained from the patient after a discussion of the risks, benefits and alternatives to treatment. Questions regarding the procedure were encouraged and answered. A timeout was performed prior to the initiation of the procedure. Ultrasound scanning demonstrated patency of the right internal jugular vein, selected for vascular access. The right neck was prepped and draped in the usual sterile fashion, and a sterile drape was applied covering the operative field. Maximum barrier sterile technique with sterile gowns and gloves were used for the procedure. A timeout was performed prior to the initiation of the procedure. Local anesthesia was provided with 1% lidocaine. Under direct ultrasound guidance, the right internal jugular vein was accessed with a micro puncture sheath ultimately allowing placement of a 6 French vascular sheath. Slightly cranial to this initial access, the right internal jugular vein was again accessed with a micropuncture sheath ultimately allowing placement of a 6 French vascular sheath. With the use  of a guidewire, an angled pigtail catheter was advanced into the right main pulmonary artery . Pressure measurements were then obtained from the right pulmonary artery. Over a Benson wire, the pigtail catheter was exchanged for a 18 cm multi side-hole EKOS ultrasound assisted infusion catheter. With the use of a guidewire, the angled pigtail catheter was advanced into the left main pulmonary artery . Over a Benson wire, the pigtail catheter was exchanged for a 12 cm multi side-hole EKOS ultrasound assisted infusion catheter. A postprocedural fluoroscopic image was obtained of the check demonstrating final catheter positioning. Both vascular sheath were secured at the right neck with interrupted 0 silk suture. The external catheter tubing was secured and the lytic therapy was initiated. The patient tolerated the procedure well without immediate postprocedural complication. FINDINGS: Acquired pressure measurements: Right  pulmonary artery - 54/19 (mean 28) (normal: < 25/10) Following the procedure, both ultrasound assisted infusion catheter tips terminate  within the distal aspects of the bilateral lower lobe sub segmental pulmonary arteries. IMPRESSION: 1. Successful fluoroscopic guided initiation of bilateral ultrasound assisted catheter directed pulmonary arterial lysis for sub massive pulmonary embolism and right-sided heart strain. 2. Markedly elevated pressure measurements within the right main pulmonary artery compatible with critical pulmonary arterial hypertension. PLAN: - The patient will return to the interventional radiology suite following the initiation of the catheter directed pulmonary arterial lysis, for repeat pressure measurements and either removal of the catheters or continuation of the catheter directed thrombolysis. Electronically Signed   By: Corlis Leak  Hassell M.D.   On: 09/27/2015 13:29   Ir Angiogram Selective Each Additional Vessel  Result Date: 09/27/2015 INDICATION: Sub massive bilateral  pulmonary emboli superimposed on baseline heart failure. Shortness of breath. Recent inguinal hernia repair surgery. EXAM: 1. ULTRASOUND GUIDANCE FOR VENOUS ACCESS X2 2. BILATERAL PULMONARY ARTERIOGRAPHY 3. FLUOROSCOPIC GUIDED PLACEMENT OF BILATERAL PULMONARY ARTERIAL LYTIC INFUSION CATHETERS COMPARISON:  CT chest 09/26/2015 MEDICATIONS: Intravenous Fentanyl and Versed were administered as conscious sedation during continuous monitoring of the patient's level of consciousness and physiological / cardiorespiratory status by the radiology RN, with a total moderate sedation time of 20 minutes. CONTRAST:  None administered FLUOROSCOPY TIME:  3.2 minutes, 287  uGym2 DAP COMPLICATIONS: None immediate TECHNIQUE: Informed written consent was obtained from the patient after a discussion of the risks, benefits and alternatives to treatment. Questions regarding the procedure were encouraged and answered. A timeout was performed prior to the initiation of the procedure. Ultrasound scanning demonstrated patency of the right internal jugular vein, selected for vascular access. The right neck was prepped and draped in the usual sterile fashion, and a sterile drape was applied covering the operative field. Maximum barrier sterile technique with sterile gowns and gloves were used for the procedure. A timeout was performed prior to the initiation of the procedure. Local anesthesia was provided with 1% lidocaine. Under direct ultrasound guidance, the right internal jugular vein was accessed with a micro puncture sheath ultimately allowing placement of a 6 French vascular sheath. Slightly cranial to this initial access, the right internal jugular vein was again accessed with a micropuncture sheath ultimately allowing placement of a 6 French vascular sheath. With the use of a guidewire, an angled pigtail catheter was advanced into the right main pulmonary artery . Pressure measurements were then obtained from the right pulmonary artery.  Over a Benson wire, the pigtail catheter was exchanged for a 18 cm multi side-hole EKOS ultrasound assisted infusion catheter. With the use of a guidewire, the angled pigtail catheter was advanced into the left main pulmonary artery . Over a Benson wire, the pigtail catheter was exchanged for a 12 cm multi side-hole EKOS ultrasound assisted infusion catheter. A postprocedural fluoroscopic image was obtained of the check demonstrating final catheter positioning. Both vascular sheath were secured at the right neck with interrupted 0 silk suture. The external catheter tubing was secured and the lytic therapy was initiated. The patient tolerated the procedure well without immediate postprocedural complication. FINDINGS: Acquired pressure measurements: Right  pulmonary artery - 54/19 (mean 28) (normal: < 25/10) Following the procedure, both ultrasound assisted infusion catheter tips terminate within the distal aspects of the bilateral lower lobe sub segmental pulmonary arteries. IMPRESSION: 1. Successful fluoroscopic guided initiation of bilateral ultrasound assisted catheter directed pulmonary arterial lysis for sub massive pulmonary embolism and right-sided heart strain. 2. Markedly elevated pressure measurements within the right main pulmonary artery compatible with critical pulmonary arterial hypertension. PLAN: - The patient will  return to the interventional radiology suite following the initiation of the catheter directed pulmonary arterial lysis, for repeat pressure measurements and either removal of the catheters or continuation of the catheter directed thrombolysis. Electronically Signed   By: Corlis Leak M.D.   On: 09/27/2015 13:29   Ir Angiogram Selective Each Additional Vessel  Result Date: 09/27/2015 INDICATION: Sub massive bilateral pulmonary emboli superimposed on baseline heart failure. Shortness of breath. Recent inguinal hernia repair surgery. EXAM: 1. ULTRASOUND GUIDANCE FOR VENOUS ACCESS X2 2.  BILATERAL PULMONARY ARTERIOGRAPHY 3. FLUOROSCOPIC GUIDED PLACEMENT OF BILATERAL PULMONARY ARTERIAL LYTIC INFUSION CATHETERS COMPARISON:  CT chest 09/26/2015 MEDICATIONS: Intravenous Fentanyl and Versed were administered as conscious sedation during continuous monitoring of the patient's level of consciousness and physiological / cardiorespiratory status by the radiology RN, with a total moderate sedation time of 20 minutes. CONTRAST:  None administered FLUOROSCOPY TIME:  3.2 minutes, 287  uGym2 DAP COMPLICATIONS: None immediate TECHNIQUE: Informed written consent was obtained from the patient after a discussion of the risks, benefits and alternatives to treatment. Questions regarding the procedure were encouraged and answered. A timeout was performed prior to the initiation of the procedure. Ultrasound scanning demonstrated patency of the right internal jugular vein, selected for vascular access. The right neck was prepped and draped in the usual sterile fashion, and a sterile drape was applied covering the operative field. Maximum barrier sterile technique with sterile gowns and gloves were used for the procedure. A timeout was performed prior to the initiation of the procedure. Local anesthesia was provided with 1% lidocaine. Under direct ultrasound guidance, the right internal jugular vein was accessed with a micro puncture sheath ultimately allowing placement of a 6 French vascular sheath. Slightly cranial to this initial access, the right internal jugular vein was again accessed with a micropuncture sheath ultimately allowing placement of a 6 French vascular sheath. With the use of a guidewire, an angled pigtail catheter was advanced into the right main pulmonary artery . Pressure measurements were then obtained from the right pulmonary artery. Over a Benson wire, the pigtail catheter was exchanged for a 18 cm multi side-hole EKOS ultrasound assisted infusion catheter. With the use of a guidewire, the angled  pigtail catheter was advanced into the left main pulmonary artery . Over a Benson wire, the pigtail catheter was exchanged for a 12 cm multi side-hole EKOS ultrasound assisted infusion catheter. A postprocedural fluoroscopic image was obtained of the check demonstrating final catheter positioning. Both vascular sheath were secured at the right neck with interrupted 0 silk suture. The external catheter tubing was secured and the lytic therapy was initiated. The patient tolerated the procedure well without immediate postprocedural complication. FINDINGS: Acquired pressure measurements: Right  pulmonary artery - 54/19 (mean 28) (normal: < 25/10) Following the procedure, both ultrasound assisted infusion catheter tips terminate within the distal aspects of the bilateral lower lobe sub segmental pulmonary arteries. IMPRESSION: 1. Successful fluoroscopic guided initiation of bilateral ultrasound assisted catheter directed pulmonary arterial lysis for sub massive pulmonary embolism and right-sided heart strain. 2. Markedly elevated pressure measurements within the right main pulmonary artery compatible with critical pulmonary arterial hypertension. PLAN: - The patient will return to the interventional radiology suite following the initiation of the catheter directed pulmonary arterial lysis, for repeat pressure measurements and either removal of the catheters or continuation of the catheter directed thrombolysis. Electronically Signed   By: Corlis Leak M.D.   On: 09/27/2015 13:29   Ir US Guide Vasc Access Right  Result Date: 09/27/2015  INDICATION: Sub massive bilateral pulmonary emboli superimposed on baseline heart failure. Shortness of breath. Recent inguinal hernia repair surgery. EXAM: 1. ULTRASOUND GUIDANCE FOR VENOUS ACCESS X2 2. BILATERAL PULMONARY ARTERIOGRAPHY 3. FLUOROSCOPIC GUIDED PLACEMENT OF BILATERAL PULMONARY ARTERIAL LYTIC INFUSION CATHETERS COMPARISON:  CT chest 09/26/2015 MEDICATIONS: Intravenous  Fentanyl and Versed were administered as conscious sedation during continuous monitoring of the patient's level of consciousness and physiological / cardiorespiratory status by the radiology RN, with a total moderate sedation time of 20 minutes. CONTRAST:  None administered FLUOROSCOPY TIME:  3.2 minutes, 287  uGym2 DAP COMPLICATIONS: None immediate TECHNIQUE: Informed written consent was obtained from the patient after a discussion of the risks, benefits and alternatives to treatment. Questions regarding the procedure were encouraged and answered. A timeout was performed prior to the initiation of the procedure. Ultrasound scanning demonstrated patency of the right internal jugular vein, selected for vascular access. The right neck was prepped and draped in the usual sterile fashion, and a sterile drape was applied covering the operative field. Maximum barrier sterile technique with sterile gowns and gloves were used for the procedure. A timeout was performed prior to the initiation of the procedure. Local anesthesia was provided with 1% lidocaine. Under direct ultrasound guidance, the right internal jugular vein was accessed with a micro puncture sheath ultimately allowing placement of a 6 French vascular sheath. Slightly cranial to this initial access, the right internal jugular vein was again accessed with a micropuncture sheath ultimately allowing placement of a 6 French vascular sheath. With the use of a guidewire, an angled pigtail catheter was advanced into the right main pulmonary artery . Pressure measurements were then obtained from the right pulmonary artery. Over a Benson wire, the pigtail catheter was exchanged for a 18 cm multi side-hole EKOS ultrasound assisted infusion catheter. With the use of a guidewire, the angled pigtail catheter was advanced into the left main pulmonary artery . Over a Benson wire, the pigtail catheter was exchanged for a 12 cm multi side-hole EKOS ultrasound assisted infusion  catheter. A postprocedural fluoroscopic image was obtained of the check demonstrating final catheter positioning. Both vascular sheath were secured at the right neck with interrupted 0 silk suture. The external catheter tubing was secured and the lytic therapy was initiated. The patient tolerated the procedure well without immediate postprocedural complication. FINDINGS: Acquired pressure measurements: Right  pulmonary artery - 54/19 (mean 28) (normal: < 25/10) Following the procedure, both ultrasound assisted infusion catheter tips terminate within the distal aspects of the bilateral lower lobe sub segmental pulmonary arteries. IMPRESSION: 1. Successful fluoroscopic guided initiation of bilateral ultrasound assisted catheter directed pulmonary arterial lysis for sub massive pulmonary embolism and right-sided heart strain. 2. Markedly elevated pressure measurements within the right main pulmonary artery compatible with critical pulmonary arterial hypertension. PLAN: - The patient will return to the interventional radiology suite following the initiation of the catheter directed pulmonary arterial lysis, for repeat pressure measurements and either removal of the catheters or continuation of the catheter directed thrombolysis. Electronically Signed   By: Corlis Leak M.D.   On: 09/27/2015 13:29   Ir Infusion Thrombol Arterial Initial (ms)  Result Date: 09/27/2015 INDICATION: Sub massive bilateral pulmonary emboli superimposed on baseline heart failure. Shortness of breath. Recent inguinal hernia repair surgery. EXAM: 1. ULTRASOUND GUIDANCE FOR VENOUS ACCESS X2 2. BILATERAL PULMONARY ARTERIOGRAPHY 3. FLUOROSCOPIC GUIDED PLACEMENT OF BILATERAL PULMONARY ARTERIAL LYTIC INFUSION CATHETERS COMPARISON:  CT chest 09/26/2015 MEDICATIONS: Intravenous Fentanyl and Versed were administered as conscious sedation during continuous  monitoring of the patient's level of consciousness and physiological / cardiorespiratory status by  the radiology RN, with a total moderate sedation time of 20 minutes. CONTRAST:  None administered FLUOROSCOPY TIME:  3.2 minutes, 287  uGym2 DAP COMPLICATIONS: None immediate TECHNIQUE: Informed written consent was obtained from the patient after a discussion of the risks, benefits and alternatives to treatment. Questions regarding the procedure were encouraged and answered. A timeout was performed prior to the initiation of the procedure. Ultrasound scanning demonstrated patency of the right internal jugular vein, selected for vascular access. The right neck was prepped and draped in the usual sterile fashion, and a sterile drape was applied covering the operative field. Maximum barrier sterile technique with sterile gowns and gloves were used for the procedure. A timeout was performed prior to the initiation of the procedure. Local anesthesia was provided with 1% lidocaine. Under direct ultrasound guidance, the right internal jugular vein was accessed with a micro puncture sheath ultimately allowing placement of a 6 French vascular sheath. Slightly cranial to this initial access, the right internal jugular vein was again accessed with a micropuncture sheath ultimately allowing placement of a 6 French vascular sheath. With the use of a guidewire, an angled pigtail catheter was advanced into the right main pulmonary artery . Pressure measurements were then obtained from the right pulmonary artery. Over a Benson wire, the pigtail catheter was exchanged for a 18 cm multi side-hole EKOS ultrasound assisted infusion catheter. With the use of a guidewire, the angled pigtail catheter was advanced into the left main pulmonary artery . Over a Benson wire, the pigtail catheter was exchanged for a 12 cm multi side-hole EKOS ultrasound assisted infusion catheter. A postprocedural fluoroscopic image was obtained of the check demonstrating final catheter positioning. Both vascular sheath were secured at the right neck with  interrupted 0 silk suture. The external catheter tubing was secured and the lytic therapy was initiated. The patient tolerated the procedure well without immediate postprocedural complication. FINDINGS: Acquired pressure measurements: Right  pulmonary artery - 54/19 (mean 28) (normal: < 25/10) Following the procedure, both ultrasound assisted infusion catheter tips terminate within the distal aspects of the bilateral lower lobe sub segmental pulmonary arteries. IMPRESSION: 1. Successful fluoroscopic guided initiation of bilateral ultrasound assisted catheter directed pulmonary arterial lysis for sub massive pulmonary embolism and right-sided heart strain. 2. Markedly elevated pressure measurements within the right main pulmonary artery compatible with critical pulmonary arterial hypertension. PLAN: - The patient will return to the interventional radiology suite following the initiation of the catheter directed pulmonary arterial lysis, for repeat pressure measurements and either removal of the catheters or continuation of the catheter directed thrombolysis. Electronically Signed   By: Corlis Leak M.D.   On: 09/27/2015 13:29   Ir Infusion Thrombol Arterial Initial (ms)  Result Date: 09/27/2015 INDICATION: Sub massive bilateral pulmonary emboli superimposed on baseline heart failure. Shortness of breath. Recent inguinal hernia repair surgery. EXAM: 1. ULTRASOUND GUIDANCE FOR VENOUS ACCESS X2 2. BILATERAL PULMONARY ARTERIOGRAPHY 3. FLUOROSCOPIC GUIDED PLACEMENT OF BILATERAL PULMONARY ARTERIAL LYTIC INFUSION CATHETERS COMPARISON:  CT chest 09/26/2015 MEDICATIONS: Intravenous Fentanyl and Versed were administered as conscious sedation during continuous monitoring of the patient's level of consciousness and physiological / cardiorespiratory status by the radiology RN, with a total moderate sedation time of 20 minutes. CONTRAST:  None administered FLUOROSCOPY TIME:  3.2 minutes, 287  uGym2 DAP COMPLICATIONS: None  immediate TECHNIQUE: Informed written consent was obtained from the patient after a discussion of the risks, benefits and  alternatives to treatment. Questions regarding the procedure were encouraged and answered. A timeout was performed prior to the initiation of the procedure. Ultrasound scanning demonstrated patency of the right internal jugular vein, selected for vascular access. The right neck was prepped and draped in the usual sterile fashion, and a sterile drape was applied covering the operative field. Maximum barrier sterile technique with sterile gowns and gloves were used for the procedure. A timeout was performed prior to the initiation of the procedure. Local anesthesia was provided with 1% lidocaine. Under direct ultrasound guidance, the right internal jugular vein was accessed with a micro puncture sheath ultimately allowing placement of a 6 French vascular sheath. Slightly cranial to this initial access, the right internal jugular vein was again accessed with a micropuncture sheath ultimately allowing placement of a 6 French vascular sheath. With the use of a guidewire, an angled pigtail catheter was advanced into the right main pulmonary artery . Pressure measurements were then obtained from the right pulmonary artery. Over a Benson wire, the pigtail catheter was exchanged for a 18 cm multi side-hole EKOS ultrasound assisted infusion catheter. With the use of a guidewire, the angled pigtail catheter was advanced into the left main pulmonary artery . Over a Benson wire, the pigtail catheter was exchanged for a 12 cm multi side-hole EKOS ultrasound assisted infusion catheter. A postprocedural fluoroscopic image was obtained of the check demonstrating final catheter positioning. Both vascular sheath were secured at the right neck with interrupted 0 silk suture. The external catheter tubing was secured and the lytic therapy was initiated. The patient tolerated the procedure well without immediate  postprocedural complication. FINDINGS: Acquired pressure measurements: Right  pulmonary artery - 54/19 (mean 28) (normal: < 25/10) Following the procedure, both ultrasound assisted infusion catheter tips terminate within the distal aspects of the bilateral lower lobe sub segmental pulmonary arteries. IMPRESSION: 1. Successful fluoroscopic guided initiation of bilateral ultrasound assisted catheter directed pulmonary arterial lysis for sub massive pulmonary embolism and right-sided heart strain. 2. Markedly elevated pressure measurements within the right main pulmonary artery compatible with critical pulmonary arterial hypertension. PLAN: - The patient will return to the interventional radiology suite following the initiation of the catheter directed pulmonary arterial lysis, for repeat pressure measurements and either removal of the catheters or continuation of the catheter directed thrombolysis. Electronically Signed   By: Corlis Leak M.D.   On: 09/27/2015 13:29   Ir Rande Lawman F/u Eval Art/ven Final Day (ms)  Result Date: 09/28/2015 CLINICAL DATA:  Sub massive bilateral pulmonary emboli, status post overnight catheter directed ultrasound assisted thrombolytic infusion. Patient symptomatically improved. EXAM: IR THROMB F/U EVAL ART/VEN FINAL DAY ANESTHESIA/SEDATION: None utilized MEDICATIONS: None utilized CONTRAST:  None utilized PROCEDURE: The right pulmonary artery catheter was withdrawn into the right pulmonary artery. Pressure measurements were obtained. Both infusion catheters were then removed. The superior right IJ venous sheath was removed and hemostasis achieved with manual compression. The inferior sheath was left in place to provide access for potential IVC filter placement, pending lower extremity Doppler studies. The patient tolerated the procedure well. COMPLICATIONS: None immediate FINDINGS: Pulmonary arterial pressure 34/11 (mean 18) mmHg (previously 54/19 (mean 28)). IMPRESSION: 1. Significant  improvement in elevated pulmonary arterial pressures post overnight catheter directed ultrasound assisted thrombolytic infusion. Electronically Signed   By: Corlis Leak M.D.   On: 09/28/2015 11:41    Latest Echo  Latest Cath   Medications:     Scheduled Medications: . potassium chloride  40 mEq Oral Daily  .  promethazine  12.5-25 mg Intravenous Once  . sodium chloride flush  3 mL Intravenous Q12H  . sodium chloride flush  3 mL Intravenous Q12H    Infusions: . heparin 1,850 Units/hr (09/28/15 1600)    PRN Medications: sodium chloride, acetaminophen, oxyCODONE, promethazine **OR** promethazine **OR** promethazine, sodium chloride flush   Assessment   1. Submassive bilateral PE with RV strain    --s/p EKOS t-pa treament 2. Non-obstructive CAD.  3. Syncope  4. LBBB. LVEF 54%  5. Elevated Troponin 6. Bilateral LE DVT  Plan    Has done well with EKOS. PA pressures down. Symptomatically much improved.  No significant bleeding evident.   Start apixaban today per CCM. AC for at least 6 months.   Appreciate CCM's care   Takao Lizer,MD 6:35 AM

## 2015-09-29 NOTE — Progress Notes (Signed)
Referring Physician(s): Dr. Nicholes Mango  Supervising Physician: Malachy Moan  Patient Status:  Inpatient  Chief Complaint:  Pulmonary embolism Bilateral lower extremities DVTs S/P PE lysis 9/23 and completion lysis 9/24 by Dr. Deanne Coffer  Subjective:  Wayne Bolton is doing well this morning. He is eager to get out of bed and walk.  He only c/o the large dressing on his neck. Couldn't turn his head, felt neck was getting stiff.   No SOB.  Allergies: No known allergies  Medications: Prior to Admission medications   Medication Sig Start Date End Date Taking? Authorizing Provider  ibuprofen (ADVIL,MOTRIN) 200 MG tablet Take 400 mg by mouth every 6 (six) hours as needed for mild pain.   Yes Historical Provider, MD  oxyCODONE (OXY IR/ROXICODONE) 5 MG immediate release tablet Take 1-2 tablets (5-10 mg total) by mouth every 4 (four) hours as needed for moderate pain, severe pain or breakthrough pain. 09/22/15  Yes Avel Peace, MD     Vital Signs: BP 131/80 (BP Location: Left Arm)   Pulse 70   Temp 98.3 F (36.8 C) (Oral)   Resp 16   Ht 6\' 1"  (1.854 m)   Wt 198 lb 3.2 oz (89.9 kg) Comment: c scale  SpO2 97%   BMI 26.15 kg/m   Physical Exam  Awake and Alert Lungs clear, normal effort Neck stick site with large dressing = I removed it Site looks good. No hematoma.  Bilateral lower extremities without edema.  Imaging: Ct Angio Chest Pe W Or Wo Contrast  Result Date: 09/26/2015 CLINICAL DATA:  Shortness of breath.  Four days postoperative EXAM: CT ANGIOGRAPHY CHEST WITH CONTRAST TECHNIQUE: Multidetector CT imaging of the chest was performed using the standard protocol during bolus administration of intravenous contrast. Multiplanar CT image reconstructions and MIPs were obtained to evaluate the vascular anatomy. CONTRAST:  100 mL Isovue 370 nonionic COMPARISON:  None. FINDINGS: Cardiovascular: There is extensive pulmonary embolus bilaterally. Pulmonary emboli  arise from the distal main pulmonary artery on the left extending into multiple upper and lower lobe branches. Pulmonary embolus on the right arises at the origin of the right intralobar pulmonary artery extending throughout the right lower lobe branches. There is right heart strain, evidenced by a right ventricle to left ventricle diameter ratio of 1.2, normal less than 0.9. There is prominence of the ascending thoracic aorta with a maximum transverse diameter of 4.0 x 3.9 cm. There is no thoracic aortic dissection. The visualized great vessels appear unremarkable. Pericardium is not appreciably thickened. Mediastinum/Nodes: Thyroid appears normal. There is no appreciable thoracic adenopathy. Lungs/Pleura: There is mild scarring in the lung apices. There is no parenchymal lung edema or consolidation. There is mild bibasilar atelectasis, however. Upper Abdomen: There is hepatic steatosis. Visualized upper abdominal structures otherwise appear unremarkable. Musculoskeletal: There are no blastic or lytic bone lesions. Review of the MIP images confirms the above findings. IMPRESSION: Positive for acute PE with CT evidence of right heart strain (RV/LV Ratio = 1.2) consistent with at least submassive (intermediate risk) PE. The presence of right heart strain has been associated with an increased risk of morbidity and mortality. Please activate Code PE by paging [phone]. Prominence of the ascending thoracic aorta with a measured transverse diameter 4.0 x 3.9 cm. Recommend annual imaging followup by CTA or MRA. This recommendation follows 2010 ACCF/AHA/AATS/ACR/ASA/SCA/SCAI/SIR/STS/SVM Guidelines for the Diagnosis and Management of Patients with Thoracic Aortic Disease. Circulation. 2010; 121: H086-V784 Mild bibasilar atelectasis. No edema or consolidation. No evident adenopathy. There  is hepatic steatosis. Critical Value/emergent results were called by telephone at the time of interpretation on 09/26/2015 at 4:38 pm  to Dr. Rito Ehrlich, covering hospitalist, who verbally acknowledged these results. Electronically Signed   By: Bretta Bang III M.D.   On: 09/26/2015 16:39   Ir Angiogram Pulmonary Bilateral Selective  Result Date: 09/27/2015 INDICATION: Sub massive bilateral pulmonary emboli superimposed on baseline heart failure. Shortness of breath. Recent inguinal hernia repair surgery. EXAM: 1. ULTRASOUND GUIDANCE FOR VENOUS ACCESS X2 2. BILATERAL PULMONARY ARTERIOGRAPHY 3. FLUOROSCOPIC GUIDED PLACEMENT OF BILATERAL PULMONARY ARTERIAL LYTIC INFUSION CATHETERS COMPARISON:  CT chest 09/26/2015 MEDICATIONS: Intravenous Fentanyl and Versed were administered as conscious sedation during continuous monitoring of the patient's level of consciousness and physiological / cardiorespiratory status by the radiology RN, with a total moderate sedation time of 20 minutes. CONTRAST:  None administered FLUOROSCOPY TIME:  3.2 minutes, 287  uGym2 DAP COMPLICATIONS: None immediate TECHNIQUE: Informed written consent was obtained from the patient after a discussion of the risks, benefits and alternatives to treatment. Questions regarding the procedure were encouraged and answered. A timeout was performed prior to the initiation of the procedure. Ultrasound scanning demonstrated patency of the right internal jugular vein, selected for vascular access. The right neck was prepped and draped in the usual sterile fashion, and a sterile drape was applied covering the operative field. Maximum barrier sterile technique with sterile gowns and gloves were used for the procedure. A timeout was performed prior to the initiation of the procedure. Local anesthesia was provided with 1% lidocaine. Under direct ultrasound guidance, the right internal jugular vein was accessed with a micro puncture sheath ultimately allowing placement of a 6 French vascular sheath. Slightly cranial to this initial access, the right internal jugular vein was again accessed with  a micropuncture sheath ultimately allowing placement of a 6 French vascular sheath. With the use of a guidewire, an angled pigtail catheter was advanced into the right main pulmonary artery . Pressure measurements were then obtained from the right pulmonary artery. Over a Benson wire, the pigtail catheter was exchanged for a 18 cm multi side-hole EKOS ultrasound assisted infusion catheter. With the use of a guidewire, the angled pigtail catheter was advanced into the left main pulmonary artery . Over a Benson wire, the pigtail catheter was exchanged for a 12 cm multi side-hole EKOS ultrasound assisted infusion catheter. A postprocedural fluoroscopic image was obtained of the check demonstrating final catheter positioning. Both vascular sheath were secured at the right neck with interrupted 0 silk suture. The external catheter tubing was secured and the lytic therapy was initiated. The patient tolerated the procedure well without immediate postprocedural complication. FINDINGS: Acquired pressure measurements: Right  pulmonary artery - 54/19 (mean 28) (normal: < 25/10) Following the procedure, both ultrasound assisted infusion catheter tips terminate within the distal aspects of the bilateral lower lobe sub segmental pulmonary arteries. IMPRESSION: 1. Successful fluoroscopic guided initiation of bilateral ultrasound assisted catheter directed pulmonary arterial lysis for sub massive pulmonary embolism and right-sided heart strain. 2. Markedly elevated pressure measurements within the right main pulmonary artery compatible with critical pulmonary arterial hypertension. PLAN: - The patient will return to the interventional radiology suite following the initiation of the catheter directed pulmonary arterial lysis, for repeat pressure measurements and either removal of the catheters or continuation of the catheter directed thrombolysis. Electronically Signed   By: Corlis Leak M.D.   On: 09/27/2015 13:29   Ir Angiogram  Selective Each Additional Vessel  Result Date: 09/27/2015 INDICATION: Sub  massive bilateral pulmonary emboli superimposed on baseline heart failure. Shortness of breath. Recent inguinal hernia repair surgery. EXAM: 1. ULTRASOUND GUIDANCE FOR VENOUS ACCESS X2 2. BILATERAL PULMONARY ARTERIOGRAPHY 3. FLUOROSCOPIC GUIDED PLACEMENT OF BILATERAL PULMONARY ARTERIAL LYTIC INFUSION CATHETERS COMPARISON:  CT chest 09/26/2015 MEDICATIONS: Intravenous Fentanyl and Versed were administered as conscious sedation during continuous monitoring of the patient's level of consciousness and physiological / cardiorespiratory status by the radiology RN, with a total moderate sedation time of 20 minutes. CONTRAST:  None administered FLUOROSCOPY TIME:  3.2 minutes, 287  uGym2 DAP COMPLICATIONS: None immediate TECHNIQUE: Informed written consent was obtained from the patient after a discussion of the risks, benefits and alternatives to treatment. Questions regarding the procedure were encouraged and answered. A timeout was performed prior to the initiation of the procedure. Ultrasound scanning demonstrated patency of the right internal jugular vein, selected for vascular access. The right neck was prepped and draped in the usual sterile fashion, and a sterile drape was applied covering the operative field. Maximum barrier sterile technique with sterile gowns and gloves were used for the procedure. A timeout was performed prior to the initiation of the procedure. Local anesthesia was provided with 1% lidocaine. Under direct ultrasound guidance, the right internal jugular vein was accessed with a micro puncture sheath ultimately allowing placement of a 6 French vascular sheath. Slightly cranial to this initial access, the right internal jugular vein was again accessed with a micropuncture sheath ultimately allowing placement of a 6 French vascular sheath. With the use of a guidewire, an angled pigtail catheter was advanced into the right  main pulmonary artery . Pressure measurements were then obtained from the right pulmonary artery. Over a Benson wire, the pigtail catheter was exchanged for a 18 cm multi side-hole EKOS ultrasound assisted infusion catheter. With the use of a guidewire, the angled pigtail catheter was advanced into the left main pulmonary artery . Over a Benson wire, the pigtail catheter was exchanged for a 12 cm multi side-hole EKOS ultrasound assisted infusion catheter. A postprocedural fluoroscopic image was obtained of the check demonstrating final catheter positioning. Both vascular sheath were secured at the right neck with interrupted 0 silk suture. The external catheter tubing was secured and the lytic therapy was initiated. The patient tolerated the procedure well without immediate postprocedural complication. FINDINGS: Acquired pressure measurements: Right  pulmonary artery - 54/19 (mean 28) (normal: < 25/10) Following the procedure, both ultrasound assisted infusion catheter tips terminate within the distal aspects of the bilateral lower lobe sub segmental pulmonary arteries. IMPRESSION: 1. Successful fluoroscopic guided initiation of bilateral ultrasound assisted catheter directed pulmonary arterial lysis for sub massive pulmonary embolism and right-sided heart strain. 2. Markedly elevated pressure measurements within the right main pulmonary artery compatible with critical pulmonary arterial hypertension. PLAN: - The patient will return to the interventional radiology suite following the initiation of the catheter directed pulmonary arterial lysis, for repeat pressure measurements and either removal of the catheters or continuation of the catheter directed thrombolysis. Electronically Signed   By: Corlis Leak M.D.   On: 09/27/2015 13:29   Ir Angiogram Selective Each Additional Vessel  Result Date: 09/27/2015 INDICATION: Sub massive bilateral pulmonary emboli superimposed on baseline heart failure. Shortness of  breath. Recent inguinal hernia repair surgery. EXAM: 1. ULTRASOUND GUIDANCE FOR VENOUS ACCESS X2 2. BILATERAL PULMONARY ARTERIOGRAPHY 3. FLUOROSCOPIC GUIDED PLACEMENT OF BILATERAL PULMONARY ARTERIAL LYTIC INFUSION CATHETERS COMPARISON:  CT chest 09/26/2015 MEDICATIONS: Intravenous Fentanyl and Versed were administered as conscious sedation during continuous monitoring of  the patient's level of consciousness and physiological / cardiorespiratory status by the radiology RN, with a total moderate sedation time of 20 minutes. CONTRAST:  None administered FLUOROSCOPY TIME:  3.2 minutes, 287  uGym2 DAP COMPLICATIONS: None immediate TECHNIQUE: Informed written consent was obtained from the patient after a discussion of the risks, benefits and alternatives to treatment. Questions regarding the procedure were encouraged and answered. A timeout was performed prior to the initiation of the procedure. Ultrasound scanning demonstrated patency of the right internal jugular vein, selected for vascular access. The right neck was prepped and draped in the usual sterile fashion, and a sterile drape was applied covering the operative field. Maximum barrier sterile technique with sterile gowns and gloves were used for the procedure. A timeout was performed prior to the initiation of the procedure. Local anesthesia was provided with 1% lidocaine. Under direct ultrasound guidance, the right internal jugular vein was accessed with a micro puncture sheath ultimately allowing placement of a 6 French vascular sheath. Slightly cranial to this initial access, the right internal jugular vein was again accessed with a micropuncture sheath ultimately allowing placement of a 6 French vascular sheath. With the use of a guidewire, an angled pigtail catheter was advanced into the right main pulmonary artery . Pressure measurements were then obtained from the right pulmonary artery. Over a Benson wire, the pigtail catheter was exchanged for a 18 cm  multi side-hole EKOS ultrasound assisted infusion catheter. With the use of a guidewire, the angled pigtail catheter was advanced into the left main pulmonary artery . Over a Benson wire, the pigtail catheter was exchanged for a 12 cm multi side-hole EKOS ultrasound assisted infusion catheter. A postprocedural fluoroscopic image was obtained of the check demonstrating final catheter positioning. Both vascular sheath were secured at the right neck with interrupted 0 silk suture. The external catheter tubing was secured and the lytic therapy was initiated. The patient tolerated the procedure well without immediate postprocedural complication. FINDINGS: Acquired pressure measurements: Right  pulmonary artery - 54/19 (mean 28) (normal: < 25/10) Following the procedure, both ultrasound assisted infusion catheter tips terminate within the distal aspects of the bilateral lower lobe sub segmental pulmonary arteries. IMPRESSION: 1. Successful fluoroscopic guided initiation of bilateral ultrasound assisted catheter directed pulmonary arterial lysis for sub massive pulmonary embolism and right-sided heart strain. 2. Markedly elevated pressure measurements within the right main pulmonary artery compatible with critical pulmonary arterial hypertension. PLAN: - The patient will return to the interventional radiology suite following the initiation of the catheter directed pulmonary arterial lysis, for repeat pressure measurements and either removal of the catheters or continuation of the catheter directed thrombolysis. Electronically Signed   By: Corlis Leak M.D.   On: 09/27/2015 13:29   Ir US Guide Vasc Access Right  Result Date: 09/27/2015 INDICATION: Sub massive bilateral pulmonary emboli superimposed on baseline heart failure. Shortness of breath. Recent inguinal hernia repair surgery. EXAM: 1. ULTRASOUND GUIDANCE FOR VENOUS ACCESS X2 2. BILATERAL PULMONARY ARTERIOGRAPHY 3. FLUOROSCOPIC GUIDED PLACEMENT OF BILATERAL  PULMONARY ARTERIAL LYTIC INFUSION CATHETERS COMPARISON:  CT chest 09/26/2015 MEDICATIONS: Intravenous Fentanyl and Versed were administered as conscious sedation during continuous monitoring of the patient's level of consciousness and physiological / cardiorespiratory status by the radiology RN, with a total moderate sedation time of 20 minutes. CONTRAST:  None administered FLUOROSCOPY TIME:  3.2 minutes, 287  uGym2 DAP COMPLICATIONS: None immediate TECHNIQUE: Informed written consent was obtained from the patient after a discussion of the risks, benefits and alternatives to treatment.  Questions regarding the procedure were encouraged and answered. A timeout was performed prior to the initiation of the procedure. Ultrasound scanning demonstrated patency of the right internal jugular vein, selected for vascular access. The right neck was prepped and draped in the usual sterile fashion, and a sterile drape was applied covering the operative field. Maximum barrier sterile technique with sterile gowns and gloves were used for the procedure. A timeout was performed prior to the initiation of the procedure. Local anesthesia was provided with 1% lidocaine. Under direct ultrasound guidance, the right internal jugular vein was accessed with a micro puncture sheath ultimately allowing placement of a 6 French vascular sheath. Slightly cranial to this initial access, the right internal jugular vein was again accessed with a micropuncture sheath ultimately allowing placement of a 6 French vascular sheath. With the use of a guidewire, an angled pigtail catheter was advanced into the right main pulmonary artery . Pressure measurements were then obtained from the right pulmonary artery. Over a Benson wire, the pigtail catheter was exchanged for a 18 cm multi side-hole EKOS ultrasound assisted infusion catheter. With the use of a guidewire, the angled pigtail catheter was advanced into the left main pulmonary artery . Over a Benson  wire, the pigtail catheter was exchanged for a 12 cm multi side-hole EKOS ultrasound assisted infusion catheter. A postprocedural fluoroscopic image was obtained of the check demonstrating final catheter positioning. Both vascular sheath were secured at the right neck with interrupted 0 silk suture. The external catheter tubing was secured and the lytic therapy was initiated. The patient tolerated the procedure well without immediate postprocedural complication. FINDINGS: Acquired pressure measurements: Right  pulmonary artery - 54/19 (mean 28) (normal: < 25/10) Following the procedure, both ultrasound assisted infusion catheter tips terminate within the distal aspects of the bilateral lower lobe sub segmental pulmonary arteries. IMPRESSION: 1. Successful fluoroscopic guided initiation of bilateral ultrasound assisted catheter directed pulmonary arterial lysis for sub massive pulmonary embolism and right-sided heart strain. 2. Markedly elevated pressure measurements within the right main pulmonary artery compatible with critical pulmonary arterial hypertension. PLAN: - The patient will return to the interventional radiology suite following the initiation of the catheter directed pulmonary arterial lysis, for repeat pressure measurements and either removal of the catheters or continuation of the catheter directed thrombolysis. Electronically Signed   By: Corlis Leak M.D.   On: 09/27/2015 13:29   [facility] Card Morphology Wo/w Cm  Result Date: 09/26/2015 CLINICAL DATA:  Cardiomyopathy EXAM: CARDIAC MRI TECHNIQUE: The patient was scanned on a 1.5 Tesla GE magnet. A dedicated cardiac coil was used. Functional imaging was done using Fiesta sequences. 2,3, and 4 chamber views were done to assess for RWMA's. Modified Simpson's rule using a short axis stack was used to calculate an ejection fraction on a dedicated work [employer]. The patient received 28 cc of Multihance. After 10 minutes inversion recovery  sequences were used to assess for infiltration and scar tissue. CONTRAST:  28 cc Multihance FINDINGS: The atria where of normal size. There was no apparent ASD/VSD or PFO. The LV had abnormal septal motion likely from LBBB. The quantitative LV EF was 54% (ESV 135 EDV 62 cc SV 73 cc) The RV was severely dilated and severely hypokinetic. Basal diameter 65 mm Mid diameter 48 mm Long Axis:  100 mm The quantitative RV EF was only 13% (EDV 194 cc ESV 170 cc SV 24 cc) IIR and IIIR with fat sat images showed no evidence of RV dysplasia Delayed  enhancement images with gadolinium showed no LV myocardial infiltration scar or infarct IMPRESSION: 1) Severe RV enlargement and hypokinesis EF 13% 2) Normal LV size and function abnormal septal motion EF 54% 3) No evidence of RV dysplasia 4) No delayed enhancement scar or infiltration of LV myocardium 5) No obvious ASD/PFO or VSD Findings discussed with Dr Beckie Busing Electronically Signed   By: Charlton Haws M.D.   On: 09/26/2015 13:38   Ir Infusion Thrombol Arterial Initial (ms)  Result Date: 09/27/2015 INDICATION: Sub massive bilateral pulmonary emboli superimposed on baseline heart failure. Shortness of breath. Recent inguinal hernia repair surgery. EXAM: 1. ULTRASOUND GUIDANCE FOR VENOUS ACCESS X2 2. BILATERAL PULMONARY ARTERIOGRAPHY 3. FLUOROSCOPIC GUIDED PLACEMENT OF BILATERAL PULMONARY ARTERIAL LYTIC INFUSION CATHETERS COMPARISON:  CT chest 09/26/2015 MEDICATIONS: Intravenous Fentanyl and Versed were administered as conscious sedation during continuous monitoring of the patient's level of consciousness and physiological / cardiorespiratory status by the radiology RN, with a total moderate sedation time of 20 minutes. CONTRAST:  None administered FLUOROSCOPY TIME:  3.2 minutes, 287  uGym2 DAP COMPLICATIONS: None immediate TECHNIQUE: Informed written consent was obtained from the patient after a discussion of the risks, benefits and alternatives to treatment.  Questions regarding the procedure were encouraged and answered. A timeout was performed prior to the initiation of the procedure. Ultrasound scanning demonstrated patency of the right internal jugular vein, selected for vascular access. The right neck was prepped and draped in the usual sterile fashion, and a sterile drape was applied covering the operative field. Maximum barrier sterile technique with sterile gowns and gloves were used for the procedure. A timeout was performed prior to the initiation of the procedure. Local anesthesia was provided with 1% lidocaine. Under direct ultrasound guidance, the right internal jugular vein was accessed with a micro puncture sheath ultimately allowing placement of a 6 French vascular sheath. Slightly cranial to this initial access, the right internal jugular vein was again accessed with a micropuncture sheath ultimately allowing placement of a 6 French vascular sheath. With the use of a guidewire, an angled pigtail catheter was advanced into the right main pulmonary artery . Pressure measurements were then obtained from the right pulmonary artery. Over a Benson wire, the pigtail catheter was exchanged for a 18 cm multi side-hole EKOS ultrasound assisted infusion catheter. With the use of a guidewire, the angled pigtail catheter was advanced into the left main pulmonary artery . Over a Benson wire, the pigtail catheter was exchanged for a 12 cm multi side-hole EKOS ultrasound assisted infusion catheter. A postprocedural fluoroscopic image was obtained of the check demonstrating final catheter positioning. Both vascular sheath were secured at the right neck with interrupted 0 silk suture. The external catheter tubing was secured and the lytic therapy was initiated. The patient tolerated the procedure well without immediate postprocedural complication. FINDINGS: Acquired pressure measurements: Right  pulmonary artery - 54/19 (mean 28) (normal: < 25/10) Following the procedure,  both ultrasound assisted infusion catheter tips terminate within the distal aspects of the bilateral lower lobe sub segmental pulmonary arteries. IMPRESSION: 1. Successful fluoroscopic guided initiation of bilateral ultrasound assisted catheter directed pulmonary arterial lysis for sub massive pulmonary embolism and right-sided heart strain. 2. Markedly elevated pressure measurements within the right main pulmonary artery compatible with critical pulmonary arterial hypertension. PLAN: - The patient will return to the interventional radiology suite following the initiation of the catheter directed pulmonary arterial lysis, for repeat pressure measurements and either removal of the catheters or continuation of the catheter  directed thrombolysis. Electronically Signed   By: Corlis Leak  Hassell M.D.   On: 09/27/2015 13:29   Ir Infusion Thrombol Arterial Initial (ms)  Result Date: 09/27/2015 INDICATION: Sub massive bilateral pulmonary emboli superimposed on baseline heart failure. Shortness of breath. Recent inguinal hernia repair surgery. EXAM: 1. ULTRASOUND GUIDANCE FOR VENOUS ACCESS X2 2. BILATERAL PULMONARY ARTERIOGRAPHY 3. FLUOROSCOPIC GUIDED PLACEMENT OF BILATERAL PULMONARY ARTERIAL LYTIC INFUSION CATHETERS COMPARISON:  CT chest 09/26/2015 MEDICATIONS: Intravenous Fentanyl and Versed were administered as conscious sedation during continuous monitoring of the patient's level of consciousness and physiological / cardiorespiratory status by the radiology RN, with a total moderate sedation time of 20 minutes. CONTRAST:  None administered FLUOROSCOPY TIME:  3.2 minutes, 287  uGym2 DAP COMPLICATIONS: None immediate TECHNIQUE: Informed written consent was obtained from the patient after a discussion of the risks, benefits and alternatives to treatment. Questions regarding the procedure were encouraged and answered. A timeout was performed prior to the initiation of the procedure. Ultrasound scanning demonstrated patency of the  right internal jugular vein, selected for vascular access. The right neck was prepped and draped in the usual sterile fashion, and a sterile drape was applied covering the operative field. Maximum barrier sterile technique with sterile gowns and gloves were used for the procedure. A timeout was performed prior to the initiation of the procedure. Local anesthesia was provided with 1% lidocaine. Under direct ultrasound guidance, the right internal jugular vein was accessed with a micro puncture sheath ultimately allowing placement of a 6 French vascular sheath. Slightly cranial to this initial access, the right internal jugular vein was again accessed with a micropuncture sheath ultimately allowing placement of a 6 French vascular sheath. With the use of a guidewire, an angled pigtail catheter was advanced into the right main pulmonary artery . Pressure measurements were then obtained from the right pulmonary artery. Over a Benson wire, the pigtail catheter was exchanged for a 18 cm multi side-hole EKOS ultrasound assisted infusion catheter. With the use of a guidewire, the angled pigtail catheter was advanced into the left main pulmonary artery . Over a Benson wire, the pigtail catheter was exchanged for a 12 cm multi side-hole EKOS ultrasound assisted infusion catheter. A postprocedural fluoroscopic image was obtained of the check demonstrating final catheter positioning. Both vascular sheath were secured at the right neck with interrupted 0 silk suture. The external catheter tubing was secured and the lytic therapy was initiated. The patient tolerated the procedure well without immediate postprocedural complication. FINDINGS: Acquired pressure measurements: Right  pulmonary artery - 54/19 (mean 28) (normal: < 25/10) Following the procedure, both ultrasound assisted infusion catheter tips terminate within the distal aspects of the bilateral lower lobe sub segmental pulmonary arteries. IMPRESSION: 1. Successful  fluoroscopic guided initiation of bilateral ultrasound assisted catheter directed pulmonary arterial lysis for sub massive pulmonary embolism and right-sided heart strain. 2. Markedly elevated pressure measurements within the right main pulmonary artery compatible with critical pulmonary arterial hypertension. PLAN: - The patient will return to the interventional radiology suite following the initiation of the catheter directed pulmonary arterial lysis, for repeat pressure measurements and either removal of the catheters or continuation of the catheter directed thrombolysis. Electronically Signed   By: Corlis Leak  Hassell M.D.   On: 09/27/2015 13:29   Ir Rande Lawmanhromb F/u Eval Art/ven Final Day (ms)  Result Date: 09/28/2015 CLINICAL DATA:  Sub massive bilateral pulmonary emboli, status post overnight catheter directed ultrasound assisted thrombolytic infusion. Patient symptomatically improved. EXAM: IR THROMB F/U EVAL ART/VEN FINAL DAY ANESTHESIA/SEDATION:  None utilized MEDICATIONS: None utilized CONTRAST:  None utilized PROCEDURE: The right pulmonary artery catheter was withdrawn into the right pulmonary artery. Pressure measurements were obtained. Both infusion catheters were then removed. The superior right IJ venous sheath was removed and hemostasis achieved with manual compression. The inferior sheath was left in place to provide access for potential IVC filter placement, pending lower extremity Doppler studies. The patient tolerated the procedure well. COMPLICATIONS: None immediate FINDINGS: Pulmonary arterial pressure 34/11 (mean 18) mmHg (previously 54/19 (mean 28)). IMPRESSION: 1. Significant improvement in elevated pulmonary arterial pressures post overnight catheter directed ultrasound assisted thrombolytic infusion. Electronically Signed   By: Corlis Leak M.D.   On: 09/28/2015 11:41    Labs:  CBC:  Recent Labs  09/27/15 2020 09/28/15 0300 09/28/15 0834 09/29/15 0603  WBC 13.8* 13.0* 12.4* 11.5*  HGB  13.7 13.2 13.2 13.6  HCT 40.5 39.5 38.8* 41.0  PLT 255 261 234 281    COAGS:  Recent Labs  09/25/15 1222  INR 1.28    BMP:  Recent Labs  09/26/15 0257 09/27/15 0632 09/28/15 0300 09/29/15 0603  NA 136 137 135 134*  K 3.8 3.5 3.7 3.7  CL 101 100* 104 103  CO2 25 23 23 23   GLUCOSE 93 102* 111* 100*  BUN 9 15 15 14   CALCIUM 8.7* 9.3 8.6* 8.9  CREATININE 0.96 0.89 0.80 0.77  GFRNONAA >60 >60 >60 >60  GFRAA >60 >60 >60 >60    LIVER FUNCTION TESTS:  Recent Labs  09/26/15 0257  BILITOT 1.0  AST 57*  ALT 83*  ALKPHOS 42  PROT 5.9*  ALBUMIN 3.2*    Assessment and Plan:  Acute bilateral DVTs with PE  S/P lysis 9/23 & 24 by Dr. Deanne Coffer  Still on heparin drip. Convert to PO anticoagulation today per CCM  I will order f/u lower extremity doppler studies at our clinic in about 3 months.  Our clinic staff will call the patient with date and time of appointment.  Electronically Signed: Gwynneth Macleod PA-C 09/29/2015, 9:28 AM   I spent a total of 15 Minutes at the the patient's bedside AND on the patient's hospital floor or unit, greater than 50% of which was counseling/coordinating care for f/u after PE lysis.

## 2015-09-29 NOTE — Progress Notes (Signed)
Wayne Bolton CMA  Benefit check for Eliquis      S/W Wayne Bolton  @ [employer] # [phone]   ELIQUIS  2.50 MG BID ( 30 ) 60 TAB   COVER- YES  CO-PAY- 20 % OF COST  TIER- 3 DRUG  PRIOR APPROVAL - NO  PHARMACY : [facility]

## 2015-09-29 NOTE — Progress Notes (Signed)
CARDIAC REHAB PHASE I   PRE:  Rate/Rhythm: 90 SR    BP: sitting 120/80    SaO2: 95 RA  MODE:  Ambulation: 460 ft   POST:  Rate/Rhythm: 109 ST    BP: sitting 132/83     SaO2: 91-92 RA  Pt able to walk with RW, initially assist x2. Initially unsteady however with distance he was able to put right heel down and walk steadier gait. Last 100 ft he did not use RW, supervision assist. He stated it felt good to walk. To recliner. VSS. Gave reminders to watch sodium, weigh daily, increase walking. Voiced understanding. Gave HF booklet. Pt not SOB or dizzy. [id number]   Wayne MassonRandi Kristan Jamye Balicki CES, ACSM 09/29/2015 1:58 PM

## 2015-09-29 NOTE — Discharge Instructions (Signed)
Information on my medicine - ELIQUIS (apixaban)  This medication education was reviewed with me or my healthcare representative as part of my discharge preparation.  The pharmacist that spoke with me during my hospital stay was:  Fredrik RiggerMarkle, Chantay Whitelock Sue, [facility]  Why was Eliquis prescribed for you? Eliquis was prescribed to treat blood clots that may have been found in the veins of your legs (deep vein thrombosis) or in your lungs (pulmonary embolism) and to reduce the risk of them occurring again.  What do You need to know about Eliquis ? The starting dose is 10 mg (two 5 mg tablets) taken TWICE daily for the FIRST SEVEN (7) DAYS, then on 10/06/15  the dose is reduced to ONE 5 mg tablet taken TWICE daily.  Eliquis may be taken with or without food.   Try to take the dose about the same time in the morning and in the evening. If you have difficulty swallowing the tablet whole please discuss with your pharmacist how to take the medication safely.  Take Eliquis exactly as prescribed and DO NOT stop taking Eliquis without talking to the doctor who prescribed the medication.  Stopping may increase your risk of developing a new blood clot.  Refill your prescription before you run out.  After discharge, you should have regular check-up appointments with your healthcare provider that is prescribing your Eliquis.    What do you do if you miss a dose? If a dose of ELIQUIS is not taken at the scheduled time, take it as soon as possible on the same day and twice-daily administration should be resumed. The dose should not be doubled to make up for a missed dose.  Important Safety Information A possible side effect of Eliquis is bleeding. You should call your healthcare provider right away if you experience any of the following: ? Bleeding from an injury or your nose that does not stop. ? Unusual colored urine (red or dark brown) or unusual colored stools (red or black). ? Unusual bruising for unknown  reasons. ? A serious fall or if you hit your head (even if there is no bleeding).  Some medicines may interact with Eliquis and might increase your risk of bleeding or clotting while on Eliquis. To help avoid this, consult your healthcare provider or pharmacist prior to using any new prescription or non-prescription medications, including herbals, vitamins, non-steroidal anti-inflammatory drugs (NSAIDs) and supplements.  This website has more information on Eliquis (apixaban): http://[email or web address].com/eliquis/home

## 2015-09-29 NOTE — Progress Notes (Signed)
PULMONARY / CRITICAL CARE MEDICINE   Name: Wayne Bolton MRN: [id number] DOB: 09/12/1965    ADMISSION DATE:  09/24/2015 CONSULTATION DATE:  09/27/15  REFERRING MD:  Dr. Gala RomneyBensimhon   CHIEF COMPLAINT:  PE   BRIEF: 50 y/o male with submassive PE post bilateral inguinal hernia repair 9/18.  Received EKOS 9/23.   SUBJECTIVE:  Pt reports feeling better.  Denies hemoptysis, chest pain, cough / bleeding.   VITAL SIGNS: BP 131/80 (BP Location: Left Arm)   Pulse 70   Temp 98.3 F (36.8 C) (Oral)   Resp 16   Ht 6\' 1"  (1.854 m)   Wt 198 lb 3.2 oz (89.9 kg) Comment: c scale  SpO2 97%   BMI 26.15 kg/m   HEMODYNAMICS:    VENTILATOR SETTINGS:    INTAKE / OUTPUT: I/O last 3 completed shifts: In: 1826.2 [P.O.:840; I.V.:986.2] Out: 2125 [Urine:2125]  PHYSICAL EXAMINATION: General:  Lying comfortably in bed, visiting with family Neuro:  Awake, alert, no distress HEENT:  NCAT OP clear, EKO's sites c/d/i Cardiovascular:  RRR, no mgr Lungs:  CTA B, normal effort Abdomen:  Belly soft, nontender, abdominal wounds c/d/i, no bleeding Musculoskeletal:  Normal bulk and tone Skin:  No rash or skin breakdown  LABS:  BMET  Recent Labs Lab 09/27/15 0632 09/28/15 0300 09/29/15 0603  NA 137 135 134*  K 3.5 3.7 3.7  CL 100* 104 103  CO2 23 23 23   BUN 15 15 14   CREATININE 0.89 0.80 0.77  GLUCOSE 102* 111* 100*    Electrolytes  Recent Labs Lab 09/25/15 1222  09/27/15 0632 09/28/15 0300 09/29/15 0603  CALCIUM 8.7*  < > 9.3 8.6* 8.9  MG 1.9  --   --   --   --   < > = values in this interval not displayed.  CBC  Recent Labs Lab 09/28/15 0300 09/28/15 0834 09/29/15 0603  WBC 13.0* 12.4* 11.5*  HGB 13.2 13.2 13.6  HCT 39.5 38.8* 41.0  PLT 261 234 281    Coag's  Recent Labs Lab 09/25/15 1222  INR 1.28    Sepsis Markers No results for input(s): LATICACIDVEN, PROCALCITON, O2SATVEN in the last 168 hours.  ABG  Recent Labs Lab 09/25/15 1437  PHART 7.433   PCO2ART 32.1  PO2ART 58.0*    Liver Enzymes  Recent Labs Lab 09/26/15 0257  AST 57*  ALT 83*  ALKPHOS 42  BILITOT 1.0  ALBUMIN 3.2*    Cardiac Enzymes  Recent Labs Lab 09/25/15 0352 09/25/15 0747 09/25/15 1222  TROPONINI 0.20* 0.29* 0.31*    Glucose  Recent Labs Lab 09/24/15 2338 09/25/15 0619 09/26/15 0549 09/27/15 0536 09/27/15 1439 09/28/15 0311  GLUCAP 133* 108* 101* 106* 102* 109*    Imaging No results found.   STUDIES:  ECHO 9/21 >> LVEF 30-35%, diffuse hypokinesis, grade 2 diastolic dysfunction, LBBB, mild AR, PA peak 35.   R/LHC on 9/21 >> minimal non-obstructive CAD, significantly elevated L/R heart filling pressures and moderate pulmonary hypertension (RA mean 20, wedge 26) Cardiac MRI 9/22 >> severe RV enlargement, hypokinesis EF 13%, normal LV size/function, no ASD/PFO or VSD. CTA Chest 9/22 >> acute PE with evidence of right heart strain (RV/LV ratio 1.2).   LE Doppler 9/24 >> acute DVT of R peroneal & soleal vein, acute DVT of L gastrocnemius vein  SIGNIFICANT EVENTS: 9/18  Inguinal hernia repair with mesh as outpatient, episodes of pre-syncope evaluated by Cardiology  9/20  Admit with sycope  9/23  EKOS  9/24  Transitioned to floor / TRH  DISCUSSION: 50 y/o M with recent hernia repair admitted with submassive PE.    ASSESSMENT / PLAN:  PULMONARY A: Acute PE - submassive, RV/LV ratio 1.2, HD stable, provoked P:   Med-surg monitoring  BLE DVT positive but no mobile clot O2 for sats > 92% Appreciate IR assistance with catheter directed lysis Continue heparin infusion Change to oral anticoagulation pm 9/25 > Eliquis Will need minimum 6 months of anticoagulation Await Factor VL and prothrombin gene mutation >>  CARDIOVASCULAR A:  Syncope - in setting of PE  Elevated Troponin  LBBB    Non-obstructive CAD  P:  Cardiology following  GASTROINTESTINAL A:   Recent Hernia Repair - 9/18 with mesh insertion  Nausea  P:   Diet  as tolerated  Monitor for bleeding  PRN zofran for nausea  HEMATOLOGIC A:   At Risk Bleeding / Anemia  P:  Trend CBC  Monitor for bleeding    FAMILY  - Updates: wife & patient updated bedside 09/29/2015  - Inter-disciplinary family meet or Palliative Care meeting due by:  day 7   Canary Brim, NP-C [facility] Pgr: [phone] or if no answer [phone] 09/29/2015, 11:00 AM  Attending Note:  50 year old male presenting with a sub-massive PE who underwent the EKOS procedure, now on RA.  On exam, lungs are clear bilaterally.  I reviewed CXR myself, no acute disease noted.  Discussed with PCCM-NP.  PE:  - D/c heparin tonight.  - Start Eliquis tonight.  Hypoxemia:  - Titrate O2 for sat of 92-95%.  - D/C O2 at this point.  DVT:  - No need for filter.  - Anticoagulation as above.  PCCM will sign off, please call back if needed.  Patient seen and examined, agree with above note.  I dictated the care and orders written for this patient under my direction.  Alyson Reedy, MD [phone]

## 2015-09-29 NOTE — Progress Notes (Signed)
PROGRESS NOTE    Wayne Bolton  MWU:[id number] DOB: 08/11/1965 DOA: 09/24/2015 PCP: No PCP Per Patient    Brief Narrative:  50 y/o male with submassive PE post bilateral inguinal hernia repair 9/18.  Received EKOS 9/23.   Assessment & Plan:   Principal Problem:   Acute saddle pulmonary embolism with acute cor pulmonale (HCC) Active Problems:   Syncope   Deep vein thrombosis (DVT) of proximal vein of both lower extremities (HCC)   LBBB (left bundle branch block)   Elevated troponin   Cardiomyopathy (HCC)   Acute combined systolic and diastolic congestive heart failure (HCC)   Hypoxemia   #1 acute submassive PE, RV/LV ratio 1.2, provoked/bilateral lower extremity DVT s/p EKOS Patient with clinical improvement. Patient with bilateral lower extremity DVT that is not mobile. Patient on room air with sats greater than 92%. Status post catheter direct and lysis per interventional radiology. Patient was on IV heparin and has subsequently been [facility] will likely need a minimum of 6 was of anticoagulation per pulmonary. Patient will need factor V Leiden and prothrombin gene mutation assessed. Pulmonary and cardiology and interventional radiology following and appreciate input and recommendations.  #2 syncope in the setting of PE Likely secondary to problem #1. No further syncopal episodes.  #3.Left bundle branch block/nonobstructive coronary artery disease/left ventricular EF 54%/elevated troponin Likely secondary to problem #1. Per cardiology.  #4 status post recent bilateral inguinal hernia repair Outpatient follow-up with general surgery.   DVT prophylaxis: Eliquis Code Status: Full Family Communication: Updated patient and wife at bedside. Disposition Plan: Home when medically stable with no further syncopal episodes.   Consultants:   Cardiology: Dr. Anne FuSkains 09/22/2015  PCCM: Dr Kendrick FriesMcQuaid  09/27/2015  Interventional radiology  Procedures:  ECHO 9/21 >> LVEF 30-35%, diffuse hypokinesis, grade 2 diastolic dysfunction, LBBB, mild AR, PA peak 35.   R/LHC on 9/21 >> minimal non-obstructive CAD, significantly elevated L/R heart filling pressures and moderate pulmonary hypertension (RA mean 20, wedge 26) Cardiac MRI 9/22 >> severe RV enlargement, hypokinesis EF 13%, normal LV size/function, no ASD/PFO or VSD. CTA Chest 9/22 >> acute PE with evidence of right heart strain (RV/LV ratio 1.2).   LE Doppler 9/24 >> acute DVT of R peroneal & soleal vein, acute DVT of L gastrocnemius vein  SIGNIFICANT EVENTS: 9/18  Inguinal hernia repair with mesh as outpatient, episodes of pre-syncope evaluated by Cardiology  9/20  Admit with sycope  9/23  EKOS  9/24  Transitioned to floor / TRH  Antimicrobials:   None   Subjective: Patient denies chest pain or shortness of breath. Patient with no further syncopal episodes.  Objective: Vitals:   09/29/15 0047 09/29/15 0600 09/29/15 1212 09/29/15 1420  BP: 120/74 131/80 127/80   Pulse: 70 70 79   Resp: 18 16 16    Temp: 99.1 F (37.3 C) 98.3 F (36.8 C) (!) 100.4 F (38 C) 99 F (37.2 C)  TempSrc: Oral Oral Oral Oral  SpO2: 96% 97% 97%   Weight:  89.9 kg (198 lb 3.2 oz)    Height:        Intake/Output Summary (Last 24 hours) at 09/29/15 1808 Last data filed at 09/29/15 1500  Gross per 24 hour  Intake              820 ml  Output             1026 ml  Net             -  206 ml   Filed Weights   09/28/15 0200 09/28/15 1748 09/29/15 0600  Weight: 90.4 kg (199 lb 4.7 oz) 91.8 kg (202 lb 6.4 oz) 89.9 kg (198 lb 3.2 oz)    Examination:  General exam: Appears calm and comfortable  Respiratory system: Clear to auscultation. Respiratory effort normal. Cardiovascular system: S1 & S2 heard, RRR. No JVD, murmurs, rubs, gallops or clicks. No pedal edema. Gastrointestinal system: Abdomen is nondistended, soft and nontender. No organomegaly  or masses felt. Normal bowel sounds heard. Central nervous system: Alert and oriented. No focal neurological deficits. Extremities: Symmetric 5 x 5 power. Skin: No rashes, lesions or ulcers Psychiatry: Judgement and insight appear normal. Mood & affect appropriate.     Data Reviewed: I have personally reviewed following labs and imaging studies  CBC:  Recent Labs Lab 09/27/15 0632 09/27/15 1440 09/27/15 2020 09/28/15 0300 09/28/15 0834 09/29/15 0603  WBC 11.6* 13.3* 13.8* 13.0* 12.4* 11.5*  NEUTROABS 7.8*  --   --   --   --   --   HGB 14.0 14.5 13.7 13.2 13.2 13.6  HCT 41.9 42.6 40.5 39.5 38.8* 41.0  MCV 91.9 91.8 91.0 91.4 90.7 91.7  PLT 271 270 255 261 234 281   Basic Metabolic Panel:  Recent Labs Lab 09/25/15 1222 09/26/15 0257 09/27/15 0632 09/28/15 0300 09/29/15 0603  NA 142 136 137 135 134*  K 3.9 3.8 3.5 3.7 3.7  CL 109 101 100* 104 103  CO2 25 25 23 23 23   GLUCOSE 99 93 102* 111* 100*  BUN 7 9 15 15 14   CREATININE 0.84 0.96 0.89 0.80 0.77  CALCIUM 8.7* 8.7* 9.3 8.6* 8.9  MG 1.9  --   --   --   --    GFR: Estimated Creatinine Clearance: 123.5 mL/min (by C-G formula based on SCr of 0.77 mg/dL). Liver Function Tests:  Recent Labs Lab 09/26/15 0257  AST 57*  ALT 83*  ALKPHOS 42  BILITOT 1.0  PROT 5.9*  ALBUMIN 3.2*   No results for input(s): LIPASE, AMYLASE in the last 168 hours. No results for input(s): AMMONIA in the last 168 hours. Coagulation Profile:  Recent Labs Lab 09/25/15 1222  INR 1.28   Cardiac Enzymes:  Recent Labs Lab 09/25/15 0352 09/25/15 0747 09/25/15 1222  TROPONINI 0.20* 0.29* 0.31*   BNP (last 3 results) No results for input(s): PROBNP in the last 8760 hours. HbA1C: No results for input(s): HGBA1C in the last 72 hours. CBG:  Recent Labs Lab 09/25/15 0619 09/26/15 0549 09/27/15 0536 09/27/15 1439 09/28/15 0311  GLUCAP 108* 101* 106* 102* 109*   Lipid Profile: No results for input(s): CHOL, HDL,  LDLCALC, TRIG, CHOLHDL, LDLDIRECT in the last 72 hours. Thyroid Function Tests: No results for input(s): TSH, T4TOTAL, FREET4, T3FREE, THYROIDAB in the last 72 hours. Anemia Panel:  Recent Labs  09/27/15 0632  FERRITIN 420*   Sepsis Labs: No results for input(s): PROCALCITON, LATICACIDVEN in the last 168 hours.  Recent Results (from the past 240 hour(s))  MRSA PCR Screening     Status: None   Collection Time: 09/27/15  1:27 PM  Result Value Ref Range Status   MRSA by PCR NEGATIVE NEGATIVE Final    Comment:        The GeneXpert MRSA Assay (FDA approved for NASAL specimens only), is one component of a comprehensive MRSA colonization surveillance program. It is not intended to diagnose MRSA infection nor to guide or monitor treatment for MRSA infections.  Radiology Studies: Ir Rande Lawman F/u Eval Art/ven Final Day (ms)  Result Date: 09/28/2015 CLINICAL DATA:  Sub massive bilateral pulmonary emboli, status post overnight catheter directed ultrasound assisted thrombolytic infusion. Patient symptomatically improved. EXAM: IR THROMB F/U EVAL ART/VEN FINAL DAY ANESTHESIA/SEDATION: None utilized MEDICATIONS: None utilized CONTRAST:  None utilized PROCEDURE: The right pulmonary artery catheter was withdrawn into the right pulmonary artery. Pressure measurements were obtained. Both infusion catheters were then removed. The superior right IJ venous sheath was removed and hemostasis achieved with manual compression. The inferior sheath was left in place to provide access for potential IVC filter placement, pending lower extremity Doppler studies. The patient tolerated the procedure well. COMPLICATIONS: None immediate FINDINGS: Pulmonary arterial pressure 34/11 (mean 18) mmHg (previously 54/19 (mean 28)). IMPRESSION: 1. Significant improvement in elevated pulmonary arterial pressures post overnight catheter directed ultrasound assisted thrombolytic infusion. Electronically Signed   By: Corlis Leak M.D.   On: 09/28/2015 11:41        Scheduled Meds: . apixaban  10 mg Oral BID  . [START ON 10/06/2015] apixaban  5 mg Oral BID  . potassium chloride  40 mEq Oral Daily  . promethazine  12.5-25 mg Intravenous Once  . sodium chloride flush  3 mL Intravenous Q12H  . sodium chloride flush  3 mL Intravenous Q12H   Continuous Infusions:    LOS: 3 days    Time spent: 35 minutes    Jaydi Bray, MD [facility] Pager [phone]  If 7PM-7AM, please contact night-coverage [email or web address] Password [facility] 09/29/2015, 6:08 PM

## 2015-09-30 LAB — BASIC METABOLIC PANEL
ANION GAP: 9 (ref 5–15)
BUN: 16 mg/dL (ref 6–20)
CALCIUM: 8.9 mg/dL (ref 8.9–10.3)
CHLORIDE: 102 mmol/L (ref 101–111)
CO2: 21 mmol/L — ABNORMAL LOW (ref 22–32)
CREATININE: 0.82 mg/dL (ref 0.61–1.24)
GFR calc non Af Amer: >60 mL/min (ref >60)
Glucose, Bld: 102 mg/dL — ABNORMAL HIGH (ref 65–99)
Potassium: 3.7 mmol/L (ref 3.5–5.1)
SODIUM: 132 mmol/L — AB (ref 135–145)

## 2015-09-30 LAB — CBC
HCT: 38.6 % — ABNORMAL LOW (ref 39.0–52.0)
Hemoglobin: 12.9 g/dL — ABNORMAL LOW (ref 13.0–17.0)
MCH: 30.6 pg (ref 26.0–34.0)
MCHC: 33.4 g/dL (ref 30.0–36.0)
MCV: 91.5 fL (ref 78.0–100.0)
PLATELETS: 278 10*3/uL (ref 150–400)
RBC: 4.22 MIL/uL (ref 4.22–5.81)
RDW: 12.8 % (ref 11.5–15.5)
WBC: 11.5 10*3/uL — AB (ref 4.0–10.5)

## 2015-09-30 LAB — GLUCOSE, CAPILLARY: GLUCOSE-CAPILLARY: 92 mg/dL (ref 65–99)

## 2015-09-30 MED ORDER — APIXABAN 5 MG PO TABS: 5.0000 mg | ORAL_TABLET | Freq: Two times a day (BID) | ORAL | 4 refills | Status: DC

## 2015-09-30 MED ORDER — APIXABAN 5 MG PO TABS: 10.0000 mg | ORAL_TABLET | Freq: Two times a day (BID) | ORAL | 0 refills | Status: DC

## 2015-09-30 MED ORDER — POTASSIUM CHLORIDE CRYS ER 20 MEQ PO TBCR: 40.0000 meq | EXTENDED_RELEASE_TABLET | Freq: Every day | ORAL | 0 refills | Status: DC

## 2015-09-30 MED ORDER — APIXABAN 5 MG PO TABS: 5.0000 mg | ORAL_TABLET | Freq: Two times a day (BID) | ORAL | 3 refills | Status: DC

## 2015-09-30 NOTE — Progress Notes (Signed)
Advanced Heart Failure Rounding Note  PCP: None Primary Cardiologist: Dr. Anne FuSkains  Subjective:    Admitted 09/25/15 after syncopal episode. Was recently admitted for Inguinal hernia repair on 9/18, had syncope after surgery. LBBB was noted, felt to be vaso-vagal. Sent home with plans for Echo and Stress as outpatient.  Pt had recurrence and thus re-presented to ED.  Had + Orthostatics.     Echo positive for LV dysfunction + mild RV dysfunction. L/RHC with non obstructive CAD, elevated filling pressures and depressed cardiac output. HF team consulted to further evaluate LV/RV dysfunction.  Echo 09/25/15 LVEF 30-35%, Grade 2 DD, LBBB with septal motion abnormality, mild AI, RV mildly dilated and reduced, Mild RAE, Mod TR, PA peak pressure 35 mm Hg.  R/LHC 09/25/15 Fick CO 3.85 Fick CI 1.73 RA mean 20 RV EDP 24 PA 56/27 (37) PW 32/24 (26)  Also showed minimal, non-obstructive CAD, with 30% stenosis at the ostium of the first diagonal branch.  cMRI LVEF 54% severe RV dysfunction  Chest CT 09/26/15: submassive bilateral PE with RV strain  Started on EKOS on 9/23. PA pressures improved PAs 54->32. Catheters now out. LE u/s with bilateral DVT   Yesterday started on 10 mg eliquis twice daily. Able to ambulate down the hall. Denies SOB.      Objective:   Weight Range: 198 lb 3.2 oz (89.9 kg) Body mass index is 26.15 kg/m.   Vital Signs:   Temp:  [98.4 F (36.9 C)-100.4 F (38 C)] 98.4 F (36.9 C) (09/26 0617) Pulse Rate:  [74-80] 74 (09/26 0617) Resp:  [16-18] 18 (09/26 0617) BP: (120-137)/(77-81) 127/81 (09/26 0617) SpO2:  [96 %-97 %] 96 % (09/26 0617) Last BM Date: 09/29/15  Weight change: Filed Weights   09/28/15 0200 09/28/15 1748 09/29/15 0600  Weight: 199 lb 4.7 oz (90.4 kg) 202 lb 6.4 oz (91.8 kg) 198 lb 3.2 oz (89.9 kg)    Intake/Output:   Intake/Output Summary (Last 24 hours) at 09/30/15 0843 Last data filed at 09/30/15 0619  Gross per 24 hour  Intake               690 ml  Output                0 ml  Net              690 ml     Physical Exam:  General:  Well appearing. No resp difficulty. In bed. Wife at the bedside HEENT: normal Neck: supple. Carotids 2+ bilat; no bruits. No lymphadenopathy or thyromegaly appreciated. Cor: PMI nondisplaced. Regular rate & rhythm. No rubs, gallops or murmurs. Lungs: CTAB, normal effort Abdomen: soft, NT, ND, no HSM. No bruits or masses. +BS  Extremities: no cyanosis, clubbing, rash, edema Neuro: alert & orientedx3, cranial nerves grossly intact. moves all 4 extremities w/o difficulty. Affect pleasant  Telemetry:  NSR  Labs: CBC  Recent Labs  09/29/15 0603 09/30/15 0232  WBC 11.5* 11.5*  HGB 13.6 12.9*  HCT 41.0 38.6*  MCV 91.7 91.5  PLT 281 278   Basic Metabolic Panel  Recent Labs  09/29/15 0603 09/30/15 0232  NA 134* 132*  K 3.7 3.7  CL 103 102  CO2 23 21*  GLUCOSE 100* 102*  BUN 14 16  CREATININE 0.77 0.82  CALCIUM 8.9 8.9   Liver Function Tests No results for input(s): AST, ALT, ALKPHOS, BILITOT, PROT, ALBUMIN in the last 72 hours. No results for input(s): LIPASE, AMYLASE in the  last 72 hours. Cardiac Enzymes No results for input(s): CKTOTAL, CKMB, CKMBINDEX, TROPONINI in the last 72 hours.  BNP: BNP (last 3 results)  Recent Labs  09/26/15 0257  BNP 284.2*    ProBNP (last 3 results) No results for input(s): PROBNP in the last 8760 hours.   D-Dimer No results for input(s): DDIMER in the last 72 hours. Hemoglobin A1C No results for input(s): HGBA1C in the last 72 hours. Fasting Lipid Panel No results for input(s): CHOL, HDL, LDLCALC, TRIG, CHOLHDL, LDLDIRECT in the last 72 hours. Thyroid Function Tests No results for input(s): TSH, T4TOTAL, T3FREE, THYROIDAB in the last 72 hours.  Invalid input(s): FREET3  Other results:     Imaging/Studies:  Ir Rande Lawman F/u Eval Art/ven Final Day (ms)  Result Date: 09/28/2015 CLINICAL DATA:  Sub massive bilateral  pulmonary emboli, status post overnight catheter directed ultrasound assisted thrombolytic infusion. Patient symptomatically improved. EXAM: IR THROMB F/U EVAL ART/VEN FINAL DAY ANESTHESIA/SEDATION: None utilized MEDICATIONS: None utilized CONTRAST:  None utilized PROCEDURE: The right pulmonary artery catheter was withdrawn into the right pulmonary artery. Pressure measurements were obtained. Both infusion catheters were then removed. The superior right IJ venous sheath was removed and hemostasis achieved with manual compression. The inferior sheath was left in place to provide access for potential IVC filter placement, pending lower extremity Doppler studies. The patient tolerated the procedure well. COMPLICATIONS: None immediate FINDINGS: Pulmonary arterial pressure 34/11 (mean 18) mmHg (previously 54/19 (mean 28)). IMPRESSION: 1. Significant improvement in elevated pulmonary arterial pressures post overnight catheter directed ultrasound assisted thrombolytic infusion. Electronically Signed   By: Corlis Leak M.D.   On: 09/28/2015 11:41    Latest Echo  Latest Cath   Medications:     Scheduled Medications: . apixaban  10 mg Oral BID  . [START ON 10/06/2015] apixaban  5 mg Oral BID  . potassium chloride  40 mEq Oral Daily  . promethazine  12.5-25 mg Intravenous Once  . sodium chloride flush  3 mL Intravenous Q12H  . sodium chloride flush  3 mL Intravenous Q12H    Infusions:    PRN Medications: sodium chloride, acetaminophen, oxyCODONE, promethazine **OR** promethazine **OR** promethazine, sodium chloride flush   Assessment   1. Submassive bilateral PE with RV strain    --s/p EKOS t-pa treament 2. Non-obstructive CAD.  3. Syncope  4. LBBB. LVEF 54%  5. Elevated Troponin 6. Bilateral LE DVT  Plan    Has done well with EKOS.  Symptomatically much improved.  No significant bleeding evident.   Continue  apixaban 10 mg twice a day x1 week then 5 mg twice a day for 6 months.   Will  set up HF follow up. Plan to repeat ECHO 3 months.   Tonye Becket NP-C  8:43 AM  Patient seen and examined with Tonye Becket, NP. We discussed all aspects of the encounter. I agree with the assessment and plan as stated above.   Doing well. Agree with above. We will see as outpatient.   Bensimhon, Daniel,MD 9:20 AM

## 2015-09-30 NOTE — Progress Notes (Signed)
Pt has orders to be discharged. Discharge instructions given and pt has no additional questions at this time. Medication regimen reviewed and pt educated. Pt verbalized understanding and has no additional questions. Telemetry box removed. IV removed and site in good condition. Pt stable and waiting for transportation. 

## 2015-09-30 NOTE — Progress Notes (Signed)
Eliquis coupon card given to the patient with explanation of usage.B Heavenly Christine RN,MHA,BSN [phone] 

## 2015-09-30 NOTE — Discharge Summary (Addendum)
Physician Discharge Summary  Wayne Bolton ZOX:[id number] DOB: 01/23/1965 DOA: 09/24/2015  PCP: No PCP Per Patient  Admit date: 09/24/2015 Discharge date: 09/30/2015  Time spent: 65 minutes  Recommendations for Outpatient Follow-up:  1. Follow-up with Dr Kendrick FriesMcQuaid, pulmonary on 11/03/2015. On follow-up factor V Leiden and prothrombin gene mutation will need to be followed up upon. 2. Follow-up with Dr.Bensimhon, cardiology on 10/13/2015 3. Follow-up with PCP in 2 weeks. On follow-up patient in need a basic metabolic profile done to follow-up on electrolytes and renal function.   Discharge Diagnoses:  Principal Problem:   Acute saddle pulmonary embolism with acute cor pulmonale (HCC) Active Problems:   Syncope   Deep vein thrombosis (DVT) of proximal vein of both lower extremities (HCC)   LBBB (left bundle branch block)   Elevated troponin   Cardiomyopathy (HCC)   Acute combined systolic and diastolic congestive heart failure (HCC)   Hypoxemia   Discharge Condition: Stable and improved  Diet recommendation: Heart healthy  Filed Weights   09/28/15 0200 09/28/15 1748 09/29/15 0600  Weight: 90.4 kg (199 lb 4.7 oz) 91.8 kg (202 lb 6.4 oz) 89.9 kg (198 lb 3.2 oz)    History of present illness:  50 y/o M with PMH of migraines, LBBB (newly discovered 9/18), bilateral inguinal hernias s/p outpatient repair with insertion of mesh on 09/22/15 (per Dr. Abbey Chattersosenbower) who [facility] after passing out at home.  Per report he had nausea with vomiting after passing out.    Chart review shows that after outpatient surgery, he had difficulty urinating.  He was given IVF's during the case.  While walking to the bathroom, he became lightheaded, nauseated and pale.  SBP at that time was 100. He was hydrated and symptoms improved.  He again sat up attempting to use the urinal and became nauseated / lightheaded.  Per notes, telemetry reading noted HR 60-70's.  At that time, cardiology  evaluation felt he was likely experiencing a vasovagal episode.    At home, he had onging vomiting and had episodes of syncope.  He attempted to shower, felt tired and had repeat episodes of lightheadedness while using the bathroom.  He walked approximately 5015ft and passed out.  She activated EMS and he work before they arrived.   He was found to be orthostatic in the ER with repeat syncope.  [facility] with above complaints.  The patient was admitted for further evaluation of syncope.   Hospital Course:  #1 acute submassive PE, RV/LV ratio 1.2, provoked/bilateral lower extremity DVT s/p EKOS Patient was admitted to the hospital for syncopal episode as stated in the history of present illness above. 2-D echo which was done was concerning for EF of 30-35%, diffuse hypokinesis, grade 2 diastolic dysfunction, left bundle branch block, mild AR, PA peak of 35. Patient was seen by cardiology and underwent right and left heart catheterization on 09/25/2015 which showed minimal nonobstructive coronary artery disease, significantly elevated left and right heart filling pressures and moderate pulmonary hypertension. Patient was initially treated for acute combined systolic and diastolic CHF, nonischemic cardiomyopathy. Patient had a cardiac MRI which showed severe right ventricular enlargement, hypokinesis with a EF of 13%, normal LV size/function, no ASD/PFO. Given cardiac MRI findings patient underwent CT angiogram of chest on 922 that showed an acute PE with evidence of right heart strain. Pulmonary was consulted as well as interventional radiology and patient underwent EKOS/catheter direct and lysis on 09/26/2015. Patient was placed  on IV heparin and transferred to the ICU for observation.  Patient subsequently underwent lower extremity Dopplers which showed bilateral lower extremity DVTs that were not mobile. Patient improved clinically. Patient was on IV heparin and subsequently been  transitioned to oral anticoagulation 09/28/2013 with [facility]  which patient tolerated. Patient will likely need a minimum of 6 months of anticoagulation per pulmonary. Patient will need factor V Leiden and prothrombin gene mutation assessed which was ordered and pending at time of discharge. Patient will follow-up with cardiology and pulmonary and outpatient setting. Patient will be discharged on Eliquis 10 mg twice daily 1 week and then 5 mg twice daily for at least 6 months.  #2 syncope in the setting of PE Likely secondary to problem #1 and orthostasis. No further syncopal episodes.  #3.Left bundle branch block/nonobstructive coronary artery disease/left ventricular EF 54%/elevated troponin Likely secondary to problem #1. Patient was seen by cardiology during the hospitalization a 2-D echo done 09/25/2015 with a EF of 30-35%, grade 2 diastolic dysfunction, left bundle branch block with septal motion abnormality, mild aortic insufficiency, right ventricle mildly dilated and reduced, mild RAE, moderate TR, PA peak pressure of 35 mmHg. Patient underwent right and left heart catheterization that showed minimal nonobstructive coronary artery disease with 30% stenosis of the ostium of the first diagonal branch. EF 54% severe right ventricular dysfunction. Patient underwent = 09/27/2015 with PA pressures improving from 54-32. Patient improved clinically started on anticoagulation with eliquis, ambulating without shortness of breath. Patient will follow-up with cardiology in outpatient setting and will need a repeat echocardiogram done in 3 months.   #4 status post recent bilateral inguinal hernia repair Outpatient follow-up with general surgery.  Procedures: ECHO 9/21 >>LVEF 30-35%, diffuse hypokinesis, grade 2 diastolic dysfunction, LBBB, mild AR, PA peak 35.  R/LHC on 9/21 >> minimal non-obstructive CAD, significantly elevated L/R heart filling pressures and moderate pulmonary hypertension (RA mean  20, wedge 26) Cardiac MRI 9/22 >> severe RV enlargement, hypokinesis EF 13%, normal LV size/function, no ASD/PFO or VSD. CTA Chest 9/22 >>acute PE with evidence of right heart strain (RV/LV ratio 1.2).  LE Doppler 9/24 >>acute DVT of R peroneal & soleal vein, acute DVT of L gastrocnemius vein  SIGNIFICANT EVENTS: 9/18 Inguinal hernia repair with mesh as outpatient, episodes of pre-syncope evaluated by Cardiology  9/20 Admit with sycope  9/23 EKOS  9/24 Transitioned to floor / TRH   Consultations:  Cardiology: Dr. [name] 09/22/2015  PCCM: Dr Kendrick Fries 09/27/2015  Interventional radiology  Discharge Exam: Vitals:   09/30/15 0929 09/30/15 1100  BP: (!) 159/84 122/73  Pulse: 78 80  Resp: 18 20  Temp: 98.3 F (36.8 C) 99.3 F (37.4 C)    General: NAD Cardiovascular: RRR Respiratory: CTAB  Discharge Instructions   Discharge Instructions    Diet - low sodium heart healthy    Complete by:  As directed    Increase activity slowly    Complete by:  As directed      Current Discharge Medication List    START taking these medications   Details  apixaban (ELIQUIS) 5 MG TABS tablet Take 1-2 tablets (5-10 mg total) by mouth 2 (two) times daily. Take 2 tablets (10mg ) 2 times daily x 6 days, then start 1 tablet(5mg ) 2 times daily on 10/06/2015 Qty: 60 tablet, Refills: 3    potassium chloride SA (K-DUR,KLOR-CON) 20 MEQ tablet Take 2 tablets (40 mEq total) by mouth daily. Take for 3 days then stop. Qty: 6 tablet, Refills: 0  CONTINUE these medications which have NOT CHANGED   Details  ibuprofen (ADVIL,MOTRIN) 200 MG tablet Take 400 mg by mouth every 6 (six) hours as needed for mild pain.    oxyCODONE (OXY IR/ROXICODONE) 5 MG immediate release tablet Take 1-2 tablets (5-10 mg total) by mouth every 4 (four) hours as needed for moderate pain, severe pain or breakthrough pain. Qty: 30 tablet, Refills: 0       Allergies  Allergen Reactions  . No Known Allergies     Follow-up Information    Max Fickle, MD Follow up on 11/03/2015.   Specialty:  Pulmonary Disease Why:  Appt at 4:00 PM Contact information: [address] [place] [address] [phone]        Arvilla Meres, MD Follow up on 10/13/2015.   Specialty:  Cardiology Why:  at 2:00 Garage Code 4000. The clinic is located on the 1st floor at [facility].  Contact information: [address] [place] [address] [phone]        PCP. Schedule an appointment as soon as possible for a visit in 2 week(s).   Why:  F/U with PCP in 2 weeks.           The results of significant diagnostics from this hospitalization (including imaging, microbiology, ancillary and laboratory) are listed below for reference.    Significant Diagnostic Studies: Ct Angio Chest Pe W Or Wo Contrast  Result Date: 09/26/2015 CLINICAL DATA:  Shortness of breath.  Four days postoperative EXAM: CT ANGIOGRAPHY CHEST WITH CONTRAST TECHNIQUE: Multidetector CT imaging of the chest was performed using the standard protocol during bolus administration of intravenous contrast. Multiplanar CT image reconstructions and MIPs were obtained to evaluate the vascular anatomy. CONTRAST:  100 mL Isovue 370 nonionic COMPARISON:  None. FINDINGS: Cardiovascular: There is extensive pulmonary embolus bilaterally. Pulmonary emboli arise from the distal main pulmonary artery on the left extending into multiple upper and lower lobe branches. Pulmonary embolus on the right arises at the origin of the right intralobar pulmonary artery extending throughout the right lower lobe branches. There is right heart strain, evidenced by a right ventricle to left ventricle diameter ratio of 1.2, normal less than 0.9. There is prominence of the ascending thoracic aorta with a maximum transverse diameter of 4.0 x 3.9 cm. There is no thoracic aortic dissection. The visualized great vessels appear unremarkable. Pericardium is not  appreciably thickened. Mediastinum/Nodes: Thyroid appears normal. There is no appreciable thoracic adenopathy. Lungs/Pleura: There is mild scarring in the lung apices. There is no parenchymal lung edema or consolidation. There is mild bibasilar atelectasis, however. Upper Abdomen: There is hepatic steatosis. Visualized upper abdominal structures otherwise appear unremarkable. Musculoskeletal: There are no blastic or lytic bone lesions. Review of the MIP images confirms the above findings. IMPRESSION: Positive for acute PE with CT evidence of right heart strain (RV/LV Ratio = 1.2) consistent with at least submassive (intermediate risk) PE. The presence of right heart strain has been associated with an increased risk of morbidity and mortality. Please activate Code PE by paging [phone]. Prominence of the ascending thoracic aorta with a measured transverse diameter 4.0 x 3.9 cm. Recommend annual imaging followup by CTA or MRA. This recommendation follows 2010 ACCF/AHA/AATS/ACR/ASA/SCA/SCAI/SIR/STS/SVM Guidelines for the Diagnosis and Management of Patients with Thoracic Aortic Disease. Circulation. 2010; 121: V253-G644 Mild bibasilar atelectasis. No edema or consolidation. No evident adenopathy. There is hepatic steatosis. Critical Value/emergent results were called by telephone at the time of interpretation on 09/26/2015 at 4:38 pm  to Dr. Rito Ehrlich, covering hospitalist, who verbally acknowledged these results. Electronically Signed   By: Bretta Bang III M.D.   On: 09/26/2015 16:39   Ir Angiogram Pulmonary Bilateral Selective  Result Date: 09/27/2015 INDICATION: Sub massive bilateral pulmonary emboli superimposed on baseline heart failure. Shortness of breath. Recent inguinal hernia repair surgery. EXAM: 1. ULTRASOUND GUIDANCE FOR VENOUS ACCESS X2 2. BILATERAL PULMONARY ARTERIOGRAPHY 3. FLUOROSCOPIC GUIDED PLACEMENT OF BILATERAL PULMONARY ARTERIAL LYTIC INFUSION CATHETERS COMPARISON:  CT chest 09/26/2015  MEDICATIONS: Intravenous Fentanyl and Versed were administered as conscious sedation during continuous monitoring of the patient's level of consciousness and physiological / cardiorespiratory status by the radiology RN, with a total moderate sedation time of 20 minutes. CONTRAST:  None administered FLUOROSCOPY TIME:  3.2 minutes, 287  uGym2 DAP COMPLICATIONS: None immediate TECHNIQUE: Informed written consent was obtained from the patient after a discussion of the risks, benefits and alternatives to treatment. Questions regarding the procedure were encouraged and answered. A timeout was performed prior to the initiation of the procedure. Ultrasound scanning demonstrated patency of the right internal jugular vein, selected for vascular access. The right neck was prepped and draped in the usual sterile fashion, and a sterile drape was applied covering the operative field. Maximum barrier sterile technique with sterile gowns and gloves were used for the procedure. A timeout was performed prior to the initiation of the procedure. Local anesthesia was provided with 1% lidocaine. Under direct ultrasound guidance, the right internal jugular vein was accessed with a micro puncture sheath ultimately allowing placement of a 6 French vascular sheath. Slightly cranial to this initial access, the right internal jugular vein was again accessed with a micropuncture sheath ultimately allowing placement of a 6 French vascular sheath. With the use of a guidewire, an angled pigtail catheter was advanced into the right main pulmonary artery . Pressure measurements were then obtained from the right pulmonary artery. Over a Benson wire, the pigtail catheter was exchanged for a 18 cm multi side-hole EKOS ultrasound assisted infusion catheter. With the use of a guidewire, the angled pigtail catheter was advanced into the left main pulmonary artery . Over a Benson wire, the pigtail catheter was exchanged for a 12 cm multi side-hole EKOS  ultrasound assisted infusion catheter. A postprocedural fluoroscopic image was obtained of the check demonstrating final catheter positioning. Both vascular sheath were secured at the right neck with interrupted 0 silk suture. The external catheter tubing was secured and the lytic therapy was initiated. The patient tolerated the procedure well without immediate postprocedural complication. FINDINGS: Acquired pressure measurements: Right  pulmonary artery - 54/19 (mean 28) (normal: < 25/10) Following the procedure, both ultrasound assisted infusion catheter tips terminate within the distal aspects of the bilateral lower lobe sub segmental pulmonary arteries. IMPRESSION: 1. Successful fluoroscopic guided initiation of bilateral ultrasound assisted catheter directed pulmonary arterial lysis for sub massive pulmonary embolism and right-sided heart strain. 2. Markedly elevated pressure measurements within the right main pulmonary artery compatible with critical pulmonary arterial hypertension. PLAN: - The patient will return to the interventional radiology suite following the initiation of the catheter directed pulmonary arterial lysis, for repeat pressure measurements and either removal of the catheters or continuation of the catheter directed thrombolysis. Electronically Signed   By: Corlis Leak M.D.   On: 09/27/2015 13:29   Ir Angiogram Selective Each Additional Vessel  Result Date: 09/27/2015 INDICATION: Sub massive bilateral pulmonary emboli superimposed on baseline heart failure. Shortness of breath. Recent inguinal hernia repair surgery. EXAM: 1. ULTRASOUND GUIDANCE  FOR VENOUS ACCESS X2 2. BILATERAL PULMONARY ARTERIOGRAPHY 3. FLUOROSCOPIC GUIDED PLACEMENT OF BILATERAL PULMONARY ARTERIAL LYTIC INFUSION CATHETERS COMPARISON:  CT chest 09/26/2015 MEDICATIONS: Intravenous Fentanyl and Versed were administered as conscious sedation during continuous monitoring of the patient's level of consciousness and  physiological / cardiorespiratory status by the radiology RN, with a total moderate sedation time of 20 minutes. CONTRAST:  None administered FLUOROSCOPY TIME:  3.2 minutes, 287  uGym2 DAP COMPLICATIONS: None immediate TECHNIQUE: Informed written consent was obtained from the patient after a discussion of the risks, benefits and alternatives to treatment. Questions regarding the procedure were encouraged and answered. A timeout was performed prior to the initiation of the procedure. Ultrasound scanning demonstrated patency of the right internal jugular vein, selected for vascular access. The right neck was prepped and draped in the usual sterile fashion, and a sterile drape was applied covering the operative field. Maximum barrier sterile technique with sterile gowns and gloves were used for the procedure. A timeout was performed prior to the initiation of the procedure. Local anesthesia was provided with 1% lidocaine. Under direct ultrasound guidance, the right internal jugular vein was accessed with a micro puncture sheath ultimately allowing placement of a 6 French vascular sheath. Slightly cranial to this initial access, the right internal jugular vein was again accessed with a micropuncture sheath ultimately allowing placement of a 6 French vascular sheath. With the use of a guidewire, an angled pigtail catheter was advanced into the right main pulmonary artery . Pressure measurements were then obtained from the right pulmonary artery. Over a Benson wire, the pigtail catheter was exchanged for a 18 cm multi side-hole EKOS ultrasound assisted infusion catheter. With the use of a guidewire, the angled pigtail catheter was advanced into the left main pulmonary artery . Over a Benson wire, the pigtail catheter was exchanged for a 12 cm multi side-hole EKOS ultrasound assisted infusion catheter. A postprocedural fluoroscopic image was obtained of the check demonstrating final catheter positioning. Both vascular  sheath were secured at the right neck with interrupted 0 silk suture. The external catheter tubing was secured and the lytic therapy was initiated. The patient tolerated the procedure well without immediate postprocedural complication. FINDINGS: Acquired pressure measurements: Right  pulmonary artery - 54/19 (mean 28) (normal: < 25/10) Following the procedure, both ultrasound assisted infusion catheter tips terminate within the distal aspects of the bilateral lower lobe sub segmental pulmonary arteries. IMPRESSION: 1. Successful fluoroscopic guided initiation of bilateral ultrasound assisted catheter directed pulmonary arterial lysis for sub massive pulmonary embolism and right-sided heart strain. 2. Markedly elevated pressure measurements within the right main pulmonary artery compatible with critical pulmonary arterial hypertension. PLAN: - The patient will return to the interventional radiology suite following the initiation of the catheter directed pulmonary arterial lysis, for repeat pressure measurements and either removal of the catheters or continuation of the catheter directed thrombolysis. Electronically Signed   By: Corlis Leak M.D.   On: 09/27/2015 13:29   Ir Angiogram Selective Each Additional Vessel  Result Date: 09/27/2015 INDICATION: Sub massive bilateral pulmonary emboli superimposed on baseline heart failure. Shortness of breath. Recent inguinal hernia repair surgery. EXAM: 1. ULTRASOUND GUIDANCE FOR VENOUS ACCESS X2 2. BILATERAL PULMONARY ARTERIOGRAPHY 3. FLUOROSCOPIC GUIDED PLACEMENT OF BILATERAL PULMONARY ARTERIAL LYTIC INFUSION CATHETERS COMPARISON:  CT chest 09/26/2015 MEDICATIONS: Intravenous Fentanyl and Versed were administered as conscious sedation during continuous monitoring of the patient's level of consciousness and physiological / cardiorespiratory status by the radiology RN, with a total moderate sedation time of  20 minutes. CONTRAST:  None administered FLUOROSCOPY TIME:  3.2  minutes, 287  uGym2 DAP COMPLICATIONS: None immediate TECHNIQUE: Informed written consent was obtained from the patient after a discussion of the risks, benefits and alternatives to treatment. Questions regarding the procedure were encouraged and answered. A timeout was performed prior to the initiation of the procedure. Ultrasound scanning demonstrated patency of the right internal jugular vein, selected for vascular access. The right neck was prepped and draped in the usual sterile fashion, and a sterile drape was applied covering the operative field. Maximum barrier sterile technique with sterile gowns and gloves were used for the procedure. A timeout was performed prior to the initiation of the procedure. Local anesthesia was provided with 1% lidocaine. Under direct ultrasound guidance, the right internal jugular vein was accessed with a micro puncture sheath ultimately allowing placement of a 6 French vascular sheath. Slightly cranial to this initial access, the right internal jugular vein was again accessed with a micropuncture sheath ultimately allowing placement of a 6 French vascular sheath. With the use of a guidewire, an angled pigtail catheter was advanced into the right main pulmonary artery . Pressure measurements were then obtained from the right pulmonary artery. Over a Benson wire, the pigtail catheter was exchanged for a 18 cm multi side-hole EKOS ultrasound assisted infusion catheter. With the use of a guidewire, the angled pigtail catheter was advanced into the left main pulmonary artery . Over a Benson wire, the pigtail catheter was exchanged for a 12 cm multi side-hole EKOS ultrasound assisted infusion catheter. A postprocedural fluoroscopic image was obtained of the check demonstrating final catheter positioning. Both vascular sheath were secured at the right neck with interrupted 0 silk suture. The external catheter tubing was secured and the lytic therapy was initiated. The patient tolerated  the procedure well without immediate postprocedural complication. FINDINGS: Acquired pressure measurements: Right  pulmonary artery - 54/19 (mean 28) (normal: < 25/10) Following the procedure, both ultrasound assisted infusion catheter tips terminate within the distal aspects of the bilateral lower lobe sub segmental pulmonary arteries. IMPRESSION: 1. Successful fluoroscopic guided initiation of bilateral ultrasound assisted catheter directed pulmonary arterial lysis for sub massive pulmonary embolism and right-sided heart strain. 2. Markedly elevated pressure measurements within the right main pulmonary artery compatible with critical pulmonary arterial hypertension. PLAN: - The patient will return to the interventional radiology suite following the initiation of the catheter directed pulmonary arterial lysis, for repeat pressure measurements and either removal of the catheters or continuation of the catheter directed thrombolysis. Electronically Signed   By: Corlis Leak M.D.   On: 09/27/2015 13:29   Ir US Guide Vasc Access Right  Result Date: 09/27/2015 INDICATION: Sub massive bilateral pulmonary emboli superimposed on baseline heart failure. Shortness of breath. Recent inguinal hernia repair surgery. EXAM: 1. ULTRASOUND GUIDANCE FOR VENOUS ACCESS X2 2. BILATERAL PULMONARY ARTERIOGRAPHY 3. FLUOROSCOPIC GUIDED PLACEMENT OF BILATERAL PULMONARY ARTERIAL LYTIC INFUSION CATHETERS COMPARISON:  CT chest 09/26/2015 MEDICATIONS: Intravenous Fentanyl and Versed were administered as conscious sedation during continuous monitoring of the patient's level of consciousness and physiological / cardiorespiratory status by the radiology RN, with a total moderate sedation time of 20 minutes. CONTRAST:  None administered FLUOROSCOPY TIME:  3.2 minutes, 287  uGym2 DAP COMPLICATIONS: None immediate TECHNIQUE: Informed written consent was obtained from the patient after a discussion of the risks, benefits and alternatives to  treatment. Questions regarding the procedure were encouraged and answered. A timeout was performed prior to the initiation of the procedure. Ultrasound  scanning demonstrated patency of the right internal jugular vein, selected for vascular access. The right neck was prepped and draped in the usual sterile fashion, and a sterile drape was applied covering the operative field. Maximum barrier sterile technique with sterile gowns and gloves were used for the procedure. A timeout was performed prior to the initiation of the procedure. Local anesthesia was provided with 1% lidocaine. Under direct ultrasound guidance, the right internal jugular vein was accessed with a micro puncture sheath ultimately allowing placement of a 6 French vascular sheath. Slightly cranial to this initial access, the right internal jugular vein was again accessed with a micropuncture sheath ultimately allowing placement of a 6 French vascular sheath. With the use of a guidewire, an angled pigtail catheter was advanced into the right main pulmonary artery . Pressure measurements were then obtained from the right pulmonary artery. Over a Benson wire, the pigtail catheter was exchanged for a 18 cm multi side-hole EKOS ultrasound assisted infusion catheter. With the use of a guidewire, the angled pigtail catheter was advanced into the left main pulmonary artery . Over a Benson wire, the pigtail catheter was exchanged for a 12 cm multi side-hole EKOS ultrasound assisted infusion catheter. A postprocedural fluoroscopic image was obtained of the check demonstrating final catheter positioning. Both vascular sheath were secured at the right neck with interrupted 0 silk suture. The external catheter tubing was secured and the lytic therapy was initiated. The patient tolerated the procedure well without immediate postprocedural complication. FINDINGS: Acquired pressure measurements: Right  pulmonary artery - 54/19 (mean 28) (normal: < 25/10) Following the  procedure, both ultrasound assisted infusion catheter tips terminate within the distal aspects of the bilateral lower lobe sub segmental pulmonary arteries. IMPRESSION: 1. Successful fluoroscopic guided initiation of bilateral ultrasound assisted catheter directed pulmonary arterial lysis for sub massive pulmonary embolism and right-sided heart strain. 2. Markedly elevated pressure measurements within the right main pulmonary artery compatible with critical pulmonary arterial hypertension. PLAN: - The patient will return to the interventional radiology suite following the initiation of the catheter directed pulmonary arterial lysis, for repeat pressure measurements and either removal of the catheters or continuation of the catheter directed thrombolysis. Electronically Signed   By: Corlis Leak M.D.   On: 09/27/2015 13:29   Mr Card Morphology Wo/w Cm  Result Date: 09/26/2015 CLINICAL DATA:  Cardiomyopathy EXAM: CARDIAC MRI TECHNIQUE: The patient was scanned on a 1.5 Tesla GE magnet. A dedicated cardiac coil was used. Functional imaging was done using Fiesta sequences. 2,3, and 4 chamber views were done to assess for RWMA's. Modified Simpson's rule using a short axis stack was used to calculate an ejection fraction on a dedicated work [employer]. The patient received 28 cc of Multihance. After 10 minutes inversion recovery sequences were used to assess for infiltration and scar tissue. CONTRAST:  28 cc Multihance FINDINGS: The atria where of normal size. There was no apparent ASD/VSD or PFO. The LV had abnormal septal motion likely from LBBB. The quantitative LV EF was 54% (ESV 135 EDV 62 cc SV 73 cc) The RV was severely dilated and severely hypokinetic. Basal diameter 65 mm Mid diameter 48 mm Long Axis:  100 mm The quantitative RV EF was only 13% (EDV 194 cc ESV 170 cc SV 24 cc) IIR and IIIR with fat sat images showed no evidence of RV dysplasia Delayed enhancement images with gadolinium showed  no LV myocardial infiltration scar or infarct IMPRESSION: 1) Severe RV enlargement and hypokinesis EF  13% 2) Normal LV size and function abnormal septal motion EF 54% 3) No evidence of RV dysplasia 4) No delayed enhancement scar or infiltration of LV myocardium 5) No obvious ASD/PFO or VSD Findings discussed with Dr Beckie Busing Electronically Signed   By: Charlton Haws M.D.   On: 09/26/2015 13:38   Ir Infusion Thrombol Arterial Initial (ms)  Result Date: 09/27/2015 INDICATION: Sub massive bilateral pulmonary emboli superimposed on baseline heart failure. Shortness of breath. Recent inguinal hernia repair surgery. EXAM: 1. ULTRASOUND GUIDANCE FOR VENOUS ACCESS X2 2. BILATERAL PULMONARY ARTERIOGRAPHY 3. FLUOROSCOPIC GUIDED PLACEMENT OF BILATERAL PULMONARY ARTERIAL LYTIC INFUSION CATHETERS COMPARISON:  CT chest 09/26/2015 MEDICATIONS: Intravenous Fentanyl and Versed were administered as conscious sedation during continuous monitoring of the patient's level of consciousness and physiological / cardiorespiratory status by the radiology RN, with a total moderate sedation time of 20 minutes. CONTRAST:  None administered FLUOROSCOPY TIME:  3.2 minutes, 287  uGym2 DAP COMPLICATIONS: None immediate TECHNIQUE: Informed written consent was obtained from the patient after a discussion of the risks, benefits and alternatives to treatment. Questions regarding the procedure were encouraged and answered. A timeout was performed prior to the initiation of the procedure. Ultrasound scanning demonstrated patency of the right internal jugular vein, selected for vascular access. The right neck was prepped and draped in the usual sterile fashion, and a sterile drape was applied covering the operative field. Maximum barrier sterile technique with sterile gowns and gloves were used for the procedure. A timeout was performed prior to the initiation of the procedure. Local anesthesia was provided with 1% lidocaine. Under direct  ultrasound guidance, the right internal jugular vein was accessed with a micro puncture sheath ultimately allowing placement of a 6 French vascular sheath. Slightly cranial to this initial access, the right internal jugular vein was again accessed with a micropuncture sheath ultimately allowing placement of a 6 French vascular sheath. With the use of a guidewire, an angled pigtail catheter was advanced into the right main pulmonary artery . Pressure measurements were then obtained from the right pulmonary artery. Over a Benson wire, the pigtail catheter was exchanged for a 18 cm multi side-hole EKOS ultrasound assisted infusion catheter. With the use of a guidewire, the angled pigtail catheter was advanced into the left main pulmonary artery . Over a Benson wire, the pigtail catheter was exchanged for a 12 cm multi side-hole EKOS ultrasound assisted infusion catheter. A postprocedural fluoroscopic image was obtained of the check demonstrating final catheter positioning. Both vascular sheath were secured at the right neck with interrupted 0 silk suture. The external catheter tubing was secured and the lytic therapy was initiated. The patient tolerated the procedure well without immediate postprocedural complication. FINDINGS: Acquired pressure measurements: Right  pulmonary artery - 54/19 (mean 28) (normal: < 25/10) Following the procedure, both ultrasound assisted infusion catheter tips terminate within the distal aspects of the bilateral lower lobe sub segmental pulmonary arteries. IMPRESSION: 1. Successful fluoroscopic guided initiation of bilateral ultrasound assisted catheter directed pulmonary arterial lysis for sub massive pulmonary embolism and right-sided heart strain. 2. Markedly elevated pressure measurements within the right main pulmonary artery compatible with critical pulmonary arterial hypertension. PLAN: - The patient will return to the interventional radiology suite following the initiation of the  catheter directed pulmonary arterial lysis, for repeat pressure measurements and either removal of the catheters or continuation of the catheter directed thrombolysis. Electronically Signed   By: Corlis Leak M.D.   On: 09/27/2015 13:29   Ir Infusion  Thrombol Arterial Initial (ms)  Result Date: 09/27/2015 INDICATION: Sub massive bilateral pulmonary emboli superimposed on baseline heart failure. Shortness of breath. Recent inguinal hernia repair surgery. EXAM: 1. ULTRASOUND GUIDANCE FOR VENOUS ACCESS X2 2. BILATERAL PULMONARY ARTERIOGRAPHY 3. FLUOROSCOPIC GUIDED PLACEMENT OF BILATERAL PULMONARY ARTERIAL LYTIC INFUSION CATHETERS COMPARISON:  CT chest 09/26/2015 MEDICATIONS: Intravenous Fentanyl and Versed were administered as conscious sedation during continuous monitoring of the patient's level of consciousness and physiological / cardiorespiratory status by the radiology RN, with a total moderate sedation time of 20 minutes. CONTRAST:  None administered FLUOROSCOPY TIME:  3.2 minutes, 287  uGym2 DAP COMPLICATIONS: None immediate TECHNIQUE: Informed written consent was obtained from the patient after a discussion of the risks, benefits and alternatives to treatment. Questions regarding the procedure were encouraged and answered. A timeout was performed prior to the initiation of the procedure. Ultrasound scanning demonstrated patency of the right internal jugular vein, selected for vascular access. The right neck was prepped and draped in the usual sterile fashion, and a sterile drape was applied covering the operative field. Maximum barrier sterile technique with sterile gowns and gloves were used for the procedure. A timeout was performed prior to the initiation of the procedure. Local anesthesia was provided with 1% lidocaine. Under direct ultrasound guidance, the right internal jugular vein was accessed with a micro puncture sheath ultimately allowing placement of a 6 French vascular sheath. Slightly cranial to  this initial access, the right internal jugular vein was again accessed with a micropuncture sheath ultimately allowing placement of a 6 French vascular sheath. With the use of a guidewire, an angled pigtail catheter was advanced into the right main pulmonary artery . Pressure measurements were then obtained from the right pulmonary artery. Over a Benson wire, the pigtail catheter was exchanged for a 18 cm multi side-hole EKOS ultrasound assisted infusion catheter. With the use of a guidewire, the angled pigtail catheter was advanced into the left main pulmonary artery . Over a Benson wire, the pigtail catheter was exchanged for a 12 cm multi side-hole EKOS ultrasound assisted infusion catheter. A postprocedural fluoroscopic image was obtained of the check demonstrating final catheter positioning. Both vascular sheath were secured at the right neck with interrupted 0 silk suture. The external catheter tubing was secured and the lytic therapy was initiated. The patient tolerated the procedure well without immediate postprocedural complication. FINDINGS: Acquired pressure measurements: Right  pulmonary artery - 54/19 (mean 28) (normal: < 25/10) Following the procedure, both ultrasound assisted infusion catheter tips terminate within the distal aspects of the bilateral lower lobe sub segmental pulmonary arteries. IMPRESSION: 1. Successful fluoroscopic guided initiation of bilateral ultrasound assisted catheter directed pulmonary arterial lysis for sub massive pulmonary embolism and right-sided heart strain. 2. Markedly elevated pressure measurements within the right main pulmonary artery compatible with critical pulmonary arterial hypertension. PLAN: - The patient will return to the interventional radiology suite following the initiation of the catheter directed pulmonary arterial lysis, for repeat pressure measurements and either removal of the catheters or continuation of the catheter directed thrombolysis.  Electronically Signed   By: Corlis Leak M.D.   On: 09/27/2015 13:29   Ir Rande Lawman F/u Eval Art/ven Final Day (ms)  Result Date: 09/28/2015 CLINICAL DATA:  Sub massive bilateral pulmonary emboli, status post overnight catheter directed ultrasound assisted thrombolytic infusion. Patient symptomatically improved. EXAM: IR THROMB F/U EVAL ART/VEN FINAL DAY ANESTHESIA/SEDATION: None utilized MEDICATIONS: None utilized CONTRAST:  None utilized PROCEDURE: The right pulmonary artery catheter was withdrawn into the right  pulmonary artery. Pressure measurements were obtained. Both infusion catheters were then removed. The superior right IJ venous sheath was removed and hemostasis achieved with manual compression. The inferior sheath was left in place to provide access for potential IVC filter placement, pending lower extremity Doppler studies. The patient tolerated the procedure well. COMPLICATIONS: None immediate FINDINGS: Pulmonary arterial pressure 34/11 (mean 18) mmHg (previously 54/19 (mean 28)). IMPRESSION: 1. Significant improvement in elevated pulmonary arterial pressures post overnight catheter directed ultrasound assisted thrombolytic infusion. Electronically Signed   By: Corlis Leak M.D.   On: 09/28/2015 11:41    Microbiology: Recent Results (from the past 240 hour(s))  MRSA PCR Screening     Status: None   Collection Time: 09/27/15  1:27 PM  Result Value Ref Range Status   MRSA by PCR NEGATIVE NEGATIVE Final    Comment:        The GeneXpert MRSA Assay (FDA approved for NASAL specimens only), is one component of a comprehensive MRSA colonization surveillance program. It is not intended to diagnose MRSA infection nor to guide or monitor treatment for MRSA infections.      Labs: Basic Metabolic Panel:  Recent Labs Lab 09/25/15 1222 09/26/15 0257 09/27/15 1610 09/28/15 0300 09/29/15 0603 09/30/15 0232  NA 142 136 137 135 134* 132*  K 3.9 3.8 3.5 3.7 3.7 3.7  CL 109 101 100* 104 103  102  CO2 25 25 23 23 23  21*  GLUCOSE 99 93 102* 111* 100* 102*  BUN 7 9 15 15 14 16   CREATININE 0.84 0.96 0.89 0.80 0.77 0.82  CALCIUM 8.7* 8.7* 9.3 8.6* 8.9 8.9  MG 1.9  --   --   --   --   --    Liver Function Tests:  Recent Labs Lab 09/26/15 0257  AST 57*  ALT 83*  ALKPHOS 42  BILITOT 1.0  PROT 5.9*  ALBUMIN 3.2*   No results for input(s): LIPASE, AMYLASE in the last 168 hours. No results for input(s): AMMONIA in the last 168 hours. CBC:  Recent Labs Lab 09/27/15 0632  09/27/15 2020 09/28/15 0300 09/28/15 0834 09/29/15 0603 09/30/15 0232  WBC 11.6*  < > 13.8* 13.0* 12.4* 11.5* 11.5*  NEUTROABS 7.8*  --   --   --   --   --   --   HGB 14.0  < > 13.7 13.2 13.2 13.6 12.9*  HCT 41.9  < > 40.5 39.5 38.8* 41.0 38.6*  MCV 91.9  < > 91.0 91.4 90.7 91.7 91.5  PLT 271  < > 255 261 234 281 278  < > = values in this interval not displayed. Cardiac Enzymes:  Recent Labs Lab 09/25/15 0352 09/25/15 0747 09/25/15 1222  TROPONINI 0.20* 0.29* 0.31*   BNP: BNP (last 3 results)  Recent Labs  09/26/15 0257  BNP 284.2*    ProBNP (last 3 results) No results for input(s): PROBNP in the last 8760 hours.  CBG:  Recent Labs Lab 09/26/15 0549 09/27/15 0536 09/27/15 1439 09/28/15 0311 09/30/15 0610  GLUCAP 101* 106* 102* 109* 92       Signed:  Praise Dolecki MD.  [facility] 09/30/2015, 12:16 PM

## 2015-09-30 NOTE — Progress Notes (Signed)
PT Cancellation Note  Patient Details Name: Wayne Bolton MRN: [id number] DOB: 10/11/1965   Cancelled Treatment:    Reason Eval/Treat Not Completed: PT screened, no needs identified, will sign off   Patient reports ambulating in hall this morning with no device and minimal leg pain. Wife present and confirms no need for equipment for home use.    [name] 09/30/2015, 12:08 PM Pager [phone]

## 2015-10-01 ENCOUNTER — Telehealth: Payer: Self-pay

## 2015-10-01 NOTE — Telephone Encounter (Signed)
Pt already scheduled for HFU on 11/03/15.

## 2015-10-01 NOTE — Telephone Encounter (Signed)
-----   Message from Lupita Leashouglas B McQuaid, MD sent at 09/28/2015  2:47 PM EDT ----- A, Can we arrange hospital f/u with me or an NP in the next 2-3 weeks? Thanks B

## 2015-10-03 LAB — FACTOR 5 LEIDEN

## 2015-10-03 LAB — PROTHROMBIN GENE MUTATION

## 2015-10-13 ENCOUNTER — Encounter (HOSPITAL_COMMUNITY): Payer: Self-pay | Admitting: Internal Medicine

## 2015-10-13 ENCOUNTER — Ambulatory Visit (HOSPITAL_COMMUNITY)
Admission: RE | Admit: 2015-10-13 | Discharge: 2015-10-13 | Disposition: A | Discharge status: Home / Self Care | Payer: 59 | Source: Ambulatory Visit | Attending: Internal Medicine | Admitting: Internal Medicine

## 2015-10-13 VITALS — BP 114/72 | HR 79 | Wt 197.5 lb

## 2015-10-13 DIAGNOSIS — I2601 Septic pulmonary embolism with acute cor pulmonale: Secondary | ICD-10-CM | POA: Diagnosis not present

## 2015-10-13 DIAGNOSIS — I5041 Acute combined systolic (congestive) and diastolic (congestive) heart failure: Secondary | ICD-10-CM | POA: Diagnosis present

## 2015-10-13 DIAGNOSIS — Z9889 Other specified postprocedural states: Secondary | ICD-10-CM | POA: Diagnosis not present

## 2015-10-13 DIAGNOSIS — I429 Cardiomyopathy, unspecified: Secondary | ICD-10-CM | POA: Insufficient documentation

## 2015-10-13 DIAGNOSIS — Z7901 Long term (current) use of anticoagulants: Secondary | ICD-10-CM | POA: Diagnosis not present

## 2015-10-13 DIAGNOSIS — I272 Pulmonary hypertension, unspecified: Secondary | ICD-10-CM | POA: Diagnosis not present

## 2015-10-13 DIAGNOSIS — I2510 Atherosclerotic heart disease of native coronary artery without angina pectoris: Secondary | ICD-10-CM | POA: Insufficient documentation

## 2015-10-13 DIAGNOSIS — I447 Left bundle-branch block, unspecified: Secondary | ICD-10-CM | POA: Insufficient documentation

## 2015-10-13 DIAGNOSIS — I2699 Other pulmonary embolism without acute cor pulmonale: Secondary | ICD-10-CM | POA: Insufficient documentation

## 2015-10-13 NOTE — Patient Instructions (Signed)
No changes in medication.  No lab work.  Follow up 3 months with echocardiogram and appointment with Dr. Gala RomneyBensimhon.  Do the following things EVERYDAY: 1) Weigh yourself in the morning before breakfast. Write it down and keep it in a log. 2) Take your medicines as prescribed 3) Eat low salt foods-Limit salt (sodium) to 2000 mg per day.  4) Stay as active as you can everyday 5) Limit all fluids for the day to less than 2 liters

## 2015-10-14 NOTE — Progress Notes (Signed)
Advanced HF Clinic Note  PCP: None Primary Cardiologist: Skains/Malli Falotico  HPI:  Jarrick is a 50 y/o M with PMH of migraines. Admitted on 9/20 with syncopal episode in post-operative period (had bilateral inguinal hernia repair on 09/22/15).    (per Dr. [name]) who presented to [facility] on 9/20 via EMS after passing out at home. Per report he had nausea with vomiting after passing out.   Resented to ER on 9/20 wit syncope. Found to have new LBBB and mildly elevated troponin. 2-D echo which was done was concerning for LVEF of 30-35%, diffuse hypokinesis, grade 2 diastolic dysfunction, left bundle branch block, mild AR, PA peak of 35. Patient was seen by cardiology and underwent right and left heart catheterization on 09/25/2015 which showed minimal nonobstructive coronary artery disease, significantly elevated right heart filling pressures and moderate pulmonary hypertension. Patient was initially treated for acute combined systolic and diastolic CHF, nonischemic cardiomyopathy. Patient had a cardiac MRI which showed severe right ventricular enlargement, hypokinesis with a EF of 53%, normal LV size/function, no ASD/PFO. Given cardiac MRI findings patient underwent CT angiogram of chest on 922 that showed an acute submassive bialteral PE with evidence of right heart strain.   Pulmonary was consulted as well as interventional radiology and patient underwent EKOS/catheter direct and lysis on 09/26/2015. Patient was placed on IV heparin and transferred to the ICU for observation.  Patient subsequently underwent lower extremity Dopplers which showed bilateral lower extremity DVTs that were not mobile. Patient improved clinically.  He was transitioned to oral anticoagulation 09/28/2013 with Eliquis and discharged.   Returns for post-hospital f/u. Feels great. Breathing just about back to baseline. No CP or SOB. Leg swelling resolved. No bleeding with Eliquis.    ROS: All systems negative except as  listed in HPI, PMH and Problem List.  SH:  Social History   Social History  . Marital status: Married    Spouse name: N/A  . Number of children: N/A  . Years of education: N/A   Occupational History  . Acupuncturist    Social History Main Topics  . Smoking status: Never Smoker  . Smokeless tobacco: Never Used  . Alcohol use Yes     Comment: occasionally  . Drug use: No  . Sexual activity: Not on file   Other Topics Concern  . Not on file   Social History Narrative   Lives with wife in [place], 2 children in college.    FH:  Family History  Problem Relation Age of Onset  . Other Paternal Grandfather 65    felt fine, came home from dinner, found dead, no autopsy    Past Medical History:  Diagnosis Date  . Headache    history migraines none in last few years    Current Outpatient Prescriptions  Medication Sig Dispense Refill  . apixaban (ELIQUIS) 5 MG TABS tablet Take 5 mg by mouth 2 (two) times daily.    Marland Kitchen ibuprofen (ADVIL,MOTRIN) 200 MG tablet Take 400 mg by mouth every 6 (six) hours as needed for mild pain.     No current facility-administered medications for this encounter.     Vitals:   10/13/15 1355  BP: 114/72  Pulse: 79  SpO2: 100%  Weight: 197 lb 8 oz (89.6 kg)   PHYSICAL EXAM:  General:  Well appearing. No resp difficulty HEENT: normal Neck: supple. JVP flat. Carotids 2+ bilaterally; no bruits. No lymphadenopathy or thryomegaly appreciated. Cor: PMI normal. Regular rate & rhythm. No rubs,  gallops or murmurs. Lungs: clear Abdomen: soft, nontender, nondistended. No hepatosplenomegaly. No bruits or masses. Good bowel sounds. Extremities: no cyanosis, clubbing, rash, edema Neuro: alert & orientedx3, cranial nerves grossly intact. Moves all 4 extremities w/o difficulty. Affect pleasant.   ASSESSMENT & PLAN: 1. DVT/PE with RV failure  --provoked, occurred in post-op setting --s/p EKOS catheter lysis in 9/17 --much improved. On Eliquis  x 6 months --Continue current therapy --RV strain resolved with t-PA --See back 3-4 months with Echo.   Gabor Lusk,MD 12:31 AM

## 2015-11-03 ENCOUNTER — Encounter: Payer: Self-pay | Admitting: Pulmonary Disease

## 2015-11-03 ENCOUNTER — Ambulatory Visit (INDEPENDENT_AMBULATORY_CARE_PROVIDER_SITE_OTHER): Payer: 59 | Admitting: Pulmonary Disease

## 2015-11-03 DIAGNOSIS — I2602 Saddle embolus of pulmonary artery with acute cor pulmonale: Secondary | ICD-10-CM

## 2015-11-03 NOTE — Patient Instructions (Signed)
Keep taking your Eliquis as you are doing We will see you back in 4 months after the echocardiogram or sooner if needed

## 2015-11-03 NOTE — Assessment & Plan Note (Signed)
Wayne Bolton is done remarkably well since his hospitalization for acute submassive pulmonary embolism. Specifically, he has no dyspnea and he is slowly returning to normal activity. He has not been able to push himself to his baseline activity because of his recovery process from the hernia repair. However, given his lack of dyspnea with climbing stairs I think it looks like he's going to do well post treatment for PE.  Again, today we talked about the cause of the PE which at this point we still think is related to surgery that he notes that he was quite sedentary prior to surgery and noted some calf pain and dyspnea. Regardless the cause, this is not an idiopathic pulmonary embolism and so I agree with the plan to treat for 6 months.  He does not seem to have any evidence of bleeding. We talked about the risk of serious bleeding is in the 1-3% range on Eliquis.  Plan: Continue Xarelto for a six-month timeframe post PE Repeat echocardiogram in about 4 months in follow-up with me afterwards

## 2015-11-03 NOTE — Progress Notes (Signed)
   Subjective:    Patient ID: Wayne Bolton, male    DOB: 05/04/1965, 50 y.o.   MRN: [id number]  Synopsis: Hospitalized in September 2017 for massive pulmonary embolism after an elective laparoscopic hernia repair on 09/22/2015. Treated with catheter directed thrombolysis.  HPI Chief Complaint  Patient presents with  . Hospitalization Follow-up    HFU for PE.  pt has no complaints currently.     Wayne Bolton says that he is more recovering from his hernia surgery more than anything else.  He had some belly pain which has slowly improved.    He had some fatigue lately with long days.  No dyspnea.  He hasn't really exercised much in the last few weeks.  He is climbing stairs without difficulty. No dyspnea.  He says that he has been thinking a lot about his hospitalization and that for 3=4 weeks prior to the surgery he was much more sedentary and was not exerting himself.  He felt some cramping in his legs at this time.  He recalls feeling dyspnea prior to surgery as well.   NO bleeding episodes.  Past Medical History:  Diagnosis Date  . Headache    history migraines none in last few years      Review of Systems  Constitutional: Negative for appetite change, fatigue and fever.  HENT: Negative for rhinorrhea, sinus pressure and sneezing.   Respiratory: Negative for cough, shortness of breath and wheezing.   Cardiovascular: Negative for chest pain, palpitations and leg swelling.       Objective:   Physical Exam Vitals:   11/03/15 1606  BP: 120/74  Pulse: 65  SpO2: 98%  Weight: 200 lb (90.7 kg)  Height: 6\' 1"  (1.854 m)   RA  Gen: well appearing HENT: OP clear, neck supple PULM: CTA B, normal percussion CV: RRR, no mgr, trace edema GI: BS+, soft, nontender Derm: no cyanosis or rash Psyche: normal mood and affect   Records from cardiology reviewed where he was recommended to continue taking Eliquis.     Assessment & Plan:  Acute saddle pulmonary embolism with acute cor  pulmonale (HCC) Wayne Bolton is done remarkably well since his hospitalization for acute submassive pulmonary embolism. Specifically, he has no dyspnea and he is slowly returning to normal activity. He has not been able to push himself to his baseline activity because of his recovery process from the hernia repair. However, given his lack of dyspnea with climbing stairs I think it looks like he's going to do well post treatment for PE.  Again, today we talked about the cause of the PE which at this point we still think is related to surgery that he notes that he was quite sedentary prior to surgery and noted some calf pain and dyspnea. Regardless the cause, this is not an idiopathic pulmonary embolism and so I agree with the plan to treat for 6 months.  He does not seem to have any evidence of bleeding. We talked about the risk of serious bleeding is in the 1-3% range on Eliquis.  Plan: Continue Xarelto for a six-month timeframe post PE Repeat echocardiogram in about 4 months in follow-up with me afterwards     Current Outpatient Prescriptions:  .  apixaban (ELIQUIS) 5 MG TABS tablet, Take 5 mg by mouth 2 (two) times daily., Disp: , Rfl:  .  ibuprofen (ADVIL,MOTRIN) 200 MG tablet, Take 400 mg by mouth every 6 (six) hours as needed for mild pain., Disp: , Rfl:

## 2016-01-23 ENCOUNTER — Other Ambulatory Visit (HOSPITAL_COMMUNITY): Payer: Self-pay | Admitting: *Deleted

## 2016-01-23 MED ORDER — APIXABAN 5 MG PO TABS: 5.0000 mg | ORAL_TABLET | Freq: Two times a day (BID) | ORAL | 3 refills | Status: DC

## 2016-01-29 ENCOUNTER — Ambulatory Visit (HOSPITAL_BASED_OUTPATIENT_CLINIC_OR_DEPARTMENT_OTHER)
Admission: RE | Admit: 2016-01-29 | Discharge: 2016-01-29 | Disposition: A | Discharge status: Home / Self Care | Payer: 59 | Source: Ambulatory Visit | Attending: Internal Medicine | Admitting: Internal Medicine

## 2016-01-29 ENCOUNTER — Encounter (HOSPITAL_COMMUNITY): Payer: Self-pay | Admitting: Internal Medicine

## 2016-01-29 ENCOUNTER — Ambulatory Visit (HOSPITAL_COMMUNITY)
Admission: RE | Admit: 2016-01-29 | Discharge: 2016-01-29 | Disposition: A | Discharge status: Home / Self Care | Payer: 59 | Source: Ambulatory Visit | Attending: Internal Medicine | Admitting: Internal Medicine

## 2016-01-29 VITALS — BP 126/80 | HR 62 | Wt 203.0 lb

## 2016-01-29 DIAGNOSIS — Z8489 Family history of other specified conditions: Secondary | ICD-10-CM | POA: Diagnosis not present

## 2016-01-29 DIAGNOSIS — Z9889 Other specified postprocedural states: Secondary | ICD-10-CM | POA: Diagnosis not present

## 2016-01-29 DIAGNOSIS — I447 Left bundle-branch block, unspecified: Secondary | ICD-10-CM | POA: Insufficient documentation

## 2016-01-29 DIAGNOSIS — I272 Pulmonary hypertension, unspecified: Secondary | ICD-10-CM | POA: Insufficient documentation

## 2016-01-29 DIAGNOSIS — I429 Cardiomyopathy, unspecified: Secondary | ICD-10-CM | POA: Diagnosis not present

## 2016-01-29 DIAGNOSIS — I2609 Other pulmonary embolism with acute cor pulmonale: Secondary | ICD-10-CM | POA: Diagnosis not present

## 2016-01-29 DIAGNOSIS — Z7902 Long term (current) use of antithrombotics/antiplatelets: Secondary | ICD-10-CM | POA: Diagnosis not present

## 2016-01-29 DIAGNOSIS — I2510 Atherosclerotic heart disease of native coronary artery without angina pectoris: Secondary | ICD-10-CM | POA: Insufficient documentation

## 2016-01-29 DIAGNOSIS — I82403 Acute embolism and thrombosis of unspecified deep veins of lower extremity, bilateral: Secondary | ICD-10-CM | POA: Insufficient documentation

## 2016-01-29 NOTE — Progress Notes (Signed)
Advanced HF Clinic Note  PCP: None Primary Cardiologist: Skains/Lauraine Crespo  HPI:  Loraine LericheMark is a 51 y/o M with PMH of migraines. Admitted on 9/20 with syncopal episode in post-operative period (had bilateral inguinal hernia repair on 09/22/15).    (per Dr. Abbey Chattersosenbower) who [facility] after passing out at home. Per report he had nausea with vomiting after passing out.   Resented to ER on 9/20 wit syncope. Found to have new LBBB and mildly elevated troponin. 2-D echo which was done was concerning for LVEF of 30-35%, diffuse hypokinesis, grade 2 diastolic dysfunction, left bundle branch block, mild AR, PA peak of 35. Patient was seen by cardiology and underwent right and left heart catheterization on 09/25/2015 which showed minimal nonobstructive coronary artery disease, significantly elevated right heart filling pressures and moderate pulmonary hypertension. Patient was initially treated for acute combined systolic and diastolic CHF, nonischemic cardiomyopathy. Patient had a cardiac MRI which showed severe right ventricular enlargement, hypokinesis with a EF of 53%, normal LV size/function, no ASD/PFO. Given cardiac MRI findings patient underwent CT angiogram of chest on 922 that showed an acute submassive bialteral PE with evidence of right heart strain.   Pulmonary was consulted as well as interventional radiology and patient underwent EKOS/catheter direct and lysis on 09/26/2015. Patient was placed on IV heparin and transferred to the ICU for observation.  Patient subsequently underwent lower extremity Dopplers which showed bilateral lower extremity DVTs that were not mobile. Patient improved clinically.  He was transitioned to oral anticoagulation 09/29/2015 with Eliquis and discharged.   Returns for f/u. Feels great. Back to baseline. No dyspnea. No edema.   Echo today EF 50% RV normal Trivial TR. PA pressures are normal.   ROS: All systems negative except as listed in  HPI, PMH and Problem List.  SH:  Social History   Social History  . Marital status: Married    Spouse name: N/A  . Number of children: N/A  . Years of education: N/A   Occupational History  . Acupuncturistlectrical Engineer    Social History Main Topics  . Smoking status: Never Smoker  . Smokeless tobacco: Never Used  . Alcohol use Yes     Comment: occasionally  . Drug use: No  . Sexual activity: Not on file   Other Topics Concern  . Not on file   Social History Narrative   [place], 2 children in college.    FH:  Family History  Problem Relation Age of Onset  . Other Paternal Grandfather 1976    felt fine, came home from dinner, found dead, no autopsy    Past Medical History:  Diagnosis Date  . Headache    history migraines none in last few years    Current Outpatient Prescriptions  Medication Sig Dispense Refill  . apixaban (ELIQUIS) 5 MG TABS tablet Take 1 tablet (5 mg total) by mouth 2 (two) times daily. 60 tablet 3  . ibuprofen (ADVIL,MOTRIN) 200 MG tablet Take 400 mg by mouth every 6 (six) hours as needed for mild pain.     No current facility-administered medications for this encounter.     Vitals:   01/29/16 1146  BP: 126/80  Pulse: 62  SpO2: 98%  Weight: 203 lb (92.1 kg)   PHYSICAL EXAM:  General:  Well appearing. No resp difficulty HEENT: normal Neck: supple. JVP flat. Carotids 2+ bilaterally; no bruits. No lymphadenopathy or thryomegaly appreciated. Cor: PMI normal. Regular rate &  rhythm. No rubs, gallops or murmurs. Lungs: clear Abdomen: soft, nontender, nondistended. No hepatosplenomegaly. No bruits or masses. Good bowel sounds. Extremities: no cyanosis, clubbing, rash, edema Neuro: alert & orientedx3, cranial nerves grossly intact. Moves all 4 extremities w/o difficulty. Affect pleasant.   ASSESSMENT & PLAN: 1. DVT/PE with RV failure  --provoked, occurred in post-op setting --s/p EKOS catheter lysis in 9/17 --much improved.  Given that it was provoked PE will treat with Eliquis x 6 months then stop --Echo with normal RV today and no PAH 2. LBBB --stable. EF in normal range.  --minimal CAD on cath --continue diet and exercise  Naraya Stoneberg,MD 12:15 PM

## 2016-01-29 NOTE — Addendum Note (Signed)
Encounter addended by: Noralee SpaceHeather M Taysia Rivere, RN on: 01/29/2016 12:27 PM<BR>    Actions taken: Order list changed, Diagnosis association updated

## 2016-01-29 NOTE — Patient Instructions (Signed)
You have been referred to Dr Caro HightSkain in 3-4 months

## 2016-01-29 NOTE — Progress Notes (Signed)
Echocardiogram 2D Echocardiogram has been performed.  Wayne Bolton 01/29/2016, 11:41 AM

## 2016-01-29 NOTE — Addendum Note (Signed)
Encounter addended by: Noralee SpaceHeather M Yakov Bergen, RN on: 01/29/2016 12:25 PM<BR>    Actions taken: Sign clinical note

## 2016-03-03 ENCOUNTER — Ambulatory Visit: Payer: 59 | Admitting: Pulmonary Disease

## 2016-03-31 ENCOUNTER — Encounter: Payer: Self-pay | Admitting: Cardiology

## 2016-04-12 ENCOUNTER — Encounter: Payer: Self-pay | Admitting: Cardiology

## 2016-04-12 ENCOUNTER — Ambulatory Visit (INDEPENDENT_AMBULATORY_CARE_PROVIDER_SITE_OTHER): Payer: 59 | Admitting: Cardiology

## 2016-04-12 VITALS — BP 124/82 | HR 70 | Ht 73.0 in | Wt 206.8 lb

## 2016-04-12 DIAGNOSIS — I447 Left bundle-branch block, unspecified: Secondary | ICD-10-CM | POA: Diagnosis not present

## 2016-04-12 DIAGNOSIS — Z86711 Personal history of pulmonary embolism: Secondary | ICD-10-CM | POA: Diagnosis not present

## 2016-04-12 HISTORY — DX: Personal history of pulmonary embolism: Z86.711

## 2016-04-12 NOTE — Patient Instructions (Signed)
Medication Instructions:  The current medical regimen is effective;  continue present plan and medications.  Follow-Up: Follow up as needed.  If you need a refill on your cardiac medications before your next appointment, please call your pharmacy.  Thank you for choosing [facility]!!     

## 2016-04-12 NOTE — Progress Notes (Signed)
Cardiology Office Note    Date:  04/12/2016   ID:  Wayne Bolton, DOB [birth year], MRN [id number]  PCP:  No PCP Per Patient  Cardiologist:   Donato Schultz, MD     History of Present Illness:  Wayne Bolton is a 51 y.o. male with submassive PE post op hernia repair on 09/22/15 that presented as hypotension and syncope.  When seen originally post op in PACU, he had gotten up to go the bathroom and felt dizzy and had near syncopal episode. He was hydrated and eventually, BP improved and he was discharged from PACU. At home, 2 days later, he had another syncopal episode. New LBBB and elevated troponin noted with ECHO demonstrating new EF of 30% (PASP 35). Had Left and right heart cath with showed significantly elevated right heart filling pressures, moderate PHTN. He then had cardiac MRI which showed severe RV enlargement. No CAD. No PFO or ASD. Given the RV findings, he had CTA of chest which showed submassive bilateral PE with RV strain.   Underwent EKOS, catheter directed lysis on 9/22. Bilateral DVT's were noted. He began to show improvement hour by hour.   He was then placed on Eliquis.   ECHO EF on repeat was 50% with normal PA pressures per Dr. Prescott Gum note but report states EF 25%. Personally reviewed echocardiogram. Approximately the previous LVEF which on MRI was calculated at 54%.  Feels no SOB, no CP. Back to baseline. Hiked [place]. He did very well. No issues, no chest pain, no shortness of breath. Hernia is doing well. Son is graduating college. They are going on a trip to [place].    Past Medical History:  Diagnosis Date  . Headache    history migraines none in last few years  . History of pulmonary embolus (PE) 04/12/2016    Past Surgical History:  Procedure Laterality Date  . INGUINAL HERNIA REPAIR Bilateral 09/22/2015   Procedure: LAPAROSCOPIC BILATERAL INGUINAL HERNIA REPAIR WITH MESH;  Surgeon: Avel Peace, MD;  Location: [facility] OR;  Service: General;   Laterality: Bilateral;  . INSERTION OF MESH Bilateral 09/22/2015   Procedure: INSERTION OF MESH;  Surgeon: Avel Peace, MD;  Location: [facility] OR;  Service: General;  Laterality: Bilateral;  . IR GENERIC HISTORICAL  09/27/2015   IR ANGIOGRAM SELECTIVE EACH ADDITIONAL VESSEL 09/27/2015 Oley Balm, MD [facility]  . IR GENERIC HISTORICAL  09/27/2015   IR ANGIOGRAM SELECTIVE EACH ADDITIONAL VESSEL 09/27/2015 Oley Balm, MD [facility]  . IR GENERIC HISTORICAL  09/27/2015   IR INFUSION THROMBOL ARTERIAL INITIAL (MS) 09/27/2015 Oley Balm, MD [facility]  . IR GENERIC HISTORICAL  09/27/2015   IR US GUIDE VASC ACCESS RIGHT 09/27/2015 Oley Balm, MD [facility]  . IR GENERIC HISTORICAL  09/27/2015   IR ANGIOGRAM PULMONARY BILATERAL SELECTIVE 09/27/2015 Oley Balm, MD [facility]  . IR GENERIC HISTORICAL  09/27/2015   IR INFUSION THROMBOL ARTERIAL INITIAL (MS) 09/27/2015 Oley Balm, MD [facility]  . IR GENERIC HISTORICAL  09/28/2015   IR THROMB F/U EVAL ART/VEN FINAL DAY (MS) 09/28/2015 Oley Balm, MD [facility]  . TONSILLECTOMY      Current Medications: Outpatient Medications Prior to Visit  Medication Sig Dispense Refill  . ibuprofen (ADVIL,MOTRIN) 200 MG tablet Take 400 mg by mouth every 6 (six) hours as needed for mild pain.    Marland Kitchen apixaban (ELIQUIS) 5 MG TABS tablet Take 1 tablet (5 mg total) by mouth 2 (two) times daily. 60 tablet 3   No  facility-administered medications prior to visit.      Allergies:   No known allergies   Social History   Social History  . Marital status: Married    Spouse name: N/A  . Number of children: N/A  . Years of education: N/A   Occupational History  . Acupuncturist    Social History Main Topics  . Smoking status: Never Smoker  . Smokeless tobacco: Never Used  . Alcohol use Yes     Comment: occasionally  . Drug use: No  . Sexual activity: Not Asked   Other Topics Concern  . None   Social History Narrative     Lives with wife in [place], 2 children in college.     Family History:  The patient's family history includes Other (age of onset: 41) in his paternal grandfather.   ROS:   Please see the history of present illness.    ROS All other systems reviewed and are negative.   PHYSICAL EXAM:   VS:  BP 124/82   Pulse 70   Ht  (1.854 m)   Wt 206 lb 12.8 oz (93.8 kg)   SpO2 98%   BMI 27.28 kg/m    GEN: Well nourished, well developed, in no acute distress  HEENT: normal  Neck: no JVD, carotid bruits, or masses Cardiac: RRR; no murmurs, rubs, or gallops,no edema  Respiratory:  clear to auscultation bilaterally, normal work of breathing GI: soft, nontender, nondistended, + BS MS: no deformity or atrophy  Skin: warm and dry, no rash Neuro:  Alert and Oriented x 3, Strength and sensation are intact Psych: euthymic mood, full affect  Wt Readings from Last 3 Encounters:  04/12/16 206 lb 12.8 oz (93.8 kg)  01/29/16 203 lb (92.1 kg)  11/03/15 200 lb (90.7 kg)      Studies/Labs Reviewed:   EKG:  Prior ECG with SR and LBBB  Recent Labs: 09/25/2015: Magnesium 1.9 09/26/2015: ALT 83; B Natriuretic Peptide 284.2; TSH 5.725 09/30/2015: BUN 16; Creatinine, Ser 0.82; Hemoglobin 12.9; Platelets 278; Potassium 3.7; Sodium 132   Lipid Panel No results found for: CHOL, TRIG, HDL, CHOLHDL, VLDL, LDLCALC, LDLDIRECT  Additional studies/ records that were reviewed today include:  Hospital records and Dr. Gala Romney records reviewed.  ECHO reviewed  01/29/16 - Left ventricle: The cavity size was mildly dilated. Wall   thickness was normal. Systolic function was severely reduced. The   estimated ejection fraction was in the range of 25% to 30% with   diffuse hypokinesis; inferior akinesis. Doppler parameters are   consistent with abnormal left ventricular relaxation (grade 1   diastolic dysfunction). - Right ventricle: Systolic function was mildly reduced.    ASSESSMENT:    1. LBBB  (left bundle branch block)   2. History of pulmonary embolus (PE)      PLAN:  In order of problems listed above:  DVT/PE with RV failure  - EF discrepancy (Dr. Gala Romney EF stated 50%, report 30%). Personally viewed and septal bounce from LBBB makes EF interpretation difficult but EF is more in the low normal range (remember it was estimated 35% prior to MRI which carefully calculated EF 54% at the time of PE.   - RV mildly dilated, reduced.   - Overall he has been doing very well. No symptoms. No syncope. He is planning a trip to [place]. Aspirin on plane flight. Get up, move around, hydrate.  LBBB  - stable no changes  - No significant CAD on cath  -  diet/exercise   Medication Adjustments/Labs and Tests Ordered: Current medicines are reviewed at length with the patient today.  Concerns regarding medicines are outlined above.  Medication changes, Labs and Tests ordered today are listed in the Patient Instructions below. Patient Instructions  Medication Instructions:  The current medical regimen is effective;  continue present plan and medications.  Follow-Up: Follow up as needed.  If you need a refill on your cardiac medications before your next appointment, please call your pharmacy.  Thank you for choosing [facility]!!        Signed, Donato Schultz, MD  04/12/2016 8:43 AM    [facility] [address] [place], [place], [place]  [address] Phone: [phone]; Fax: [phone]

## 2016-05-04 ENCOUNTER — Ambulatory Visit: Payer: 59 | Admitting: Cardiology

## 2016-11-17 ENCOUNTER — Other Ambulatory Visit (HOSPITAL_COMMUNITY): Payer: Self-pay | Admitting: Interventional Radiology

## 2016-12-01 ENCOUNTER — Other Ambulatory Visit (HOSPITAL_COMMUNITY): Payer: Self-pay | Admitting: Interventional Radiology

## 2016-12-01 DIAGNOSIS — I82403 Acute embolism and thrombosis of unspecified deep veins of lower extremity, bilateral: Secondary | ICD-10-CM

## 2017-02-09 ENCOUNTER — Encounter: Payer: Self-pay | Admitting: Radiology

## 2017-05-30 IMAGING — MR MR CARD MORPHOLOGY WO/W CM
13 of 14 series · 15 of 16 positions shown · IV contrast (25    Multihance)
Comparison: none

CLINICAL DATA: Cardiomyopathy

EXAM:
CARDIAC MRI
TECHNIQUE: The patient was scanned on a 1.5 Tesla GE magnet. A dedicated
cardiac coil was used. Functional imaging was done using Fiesta
sequences. [DATE], and 4 chamber views were done to assess for RWMA's.
Modified Giorgi rule using a short axis stack was used to
calculate an ejection fraction on a dedicated work station using
Circle software. The patient received 28 cc of Multihance. After 10
minutes inversion recovery sequences were used to assess for
infiltration and scar tissue.
CONTRAST:  28 cc Multihance

[Series 3: bSSFP · sagittal · 8.0mm · 1.41mm/px · 1 of 17 slices shown (1 of 4)]
[im 1/17]
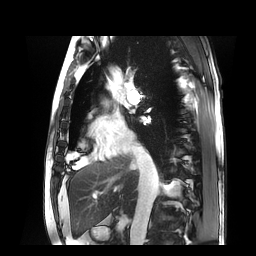

[Series 5: bSSFP · oblique · 8.0mm · 1.41mm/px · 1 of 20 slices shown (2 of 4)]
[im 1/20]
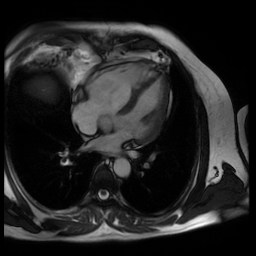

[Series 6: bSSFP · oblique · 8.0mm · 1.48mm/px · 3 of 400 slices shown (3 of 4)]
[im 1/400]
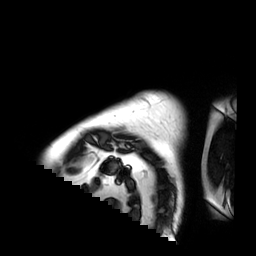
[im 200/400]
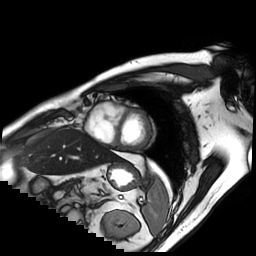
[im 400/400]
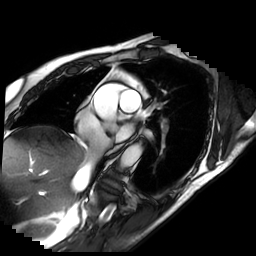

[Series 7: bSSFP · oblique · 8.0mm · 1.48mm/px · 1 of 60 slices shown (4 of 4)]
[im 1/60]
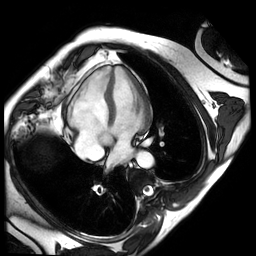

[Series 8: T1 · axial · 6.0mm · 1.17mm/px · 1 of 17 slices shown (1 of 3)]
[im 1/17]
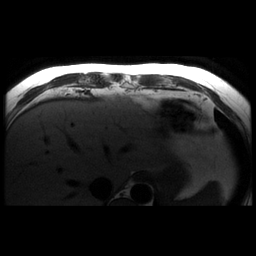

[Series 10: T1 · sagittal · 6.0mm · 1.05mm/px · 1 of 23 slices shown (2 of 3)]
[im 1/23]
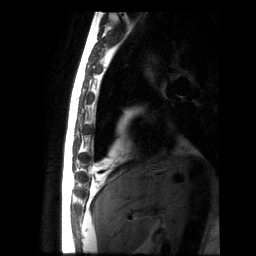

[Series 11: T1 · axial · 6.0mm · 1.17mm/px · 1 of 17 slices shown (3 of 3)]
[im 1/17]
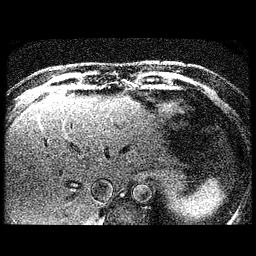

[Series 12: T2 · oblique · 8.0mm · 1.48mm/px · 1 of 60 slices shown (1 of 2)]
[im 1/60]
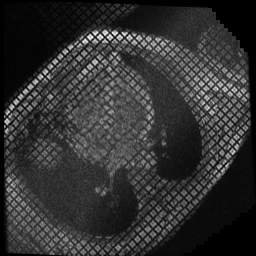

[Series 13: T2 · oblique · 8.0mm · 1.48mm/px · 1 of 60 slices shown (2 of 2)]
[im 1/60]
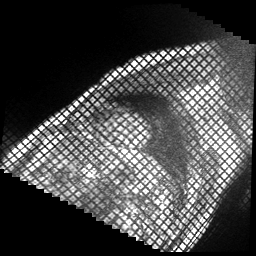

[Series 14: cine ir · oblique · 8.0mm · 1.56mm/px · 1 of 30 slices shown]
[im 1/30]
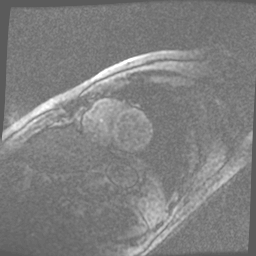

[Series 17: delayed ir prep · oblique · 8.0mm · 1.52mm/px · 1 of 8 slices shown (1 of 2)]
[im 1/8]
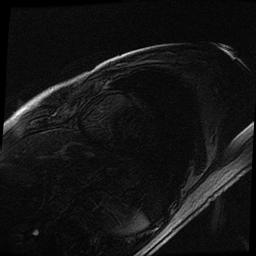

[Series 19: delayed ir prep · oblique · 8.0mm · 1.52mm/px · 1 of 14 slices shown (2 of 2)]
[im 1/14]
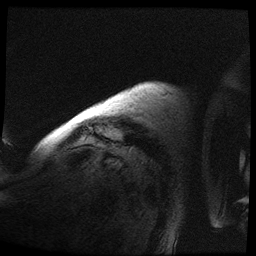

[Series 20: rad delayed ir · oblique · 8.0mm · 1.48mm/px · 1 of 3 slices shown]
[im 1/3]
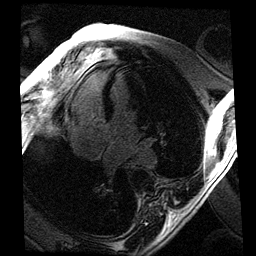

[15 of 16 positions shown; findings below may reference images not displayed]

FINDINGS: The atria where of normal size. There was no apparent ASD/VSD or
PFO. The LV had abnormal septal motion likely from LBBB. The
quantitative LV EF was 54% (ESV 135 EDV 62 cc SV 73 cc) The RV was
severely dilated and severely hypokinetic.

Basal diameter 65 mm

Mid diameter 48 mm

Long Axis:  100 mm

The quantitative RV EF was only 13% (EDV 194 cc ESV 170 cc SV 24 cc)

IIR and IIIR with fat sat images showed no evidence of RV dysplasia

Delayed enhancement images with gadolinium showed no LV myocardial
infiltration scar or infarct
IMPRESSION: 1) Severe RV enlargement and hypokinesis EF 13%

2) Normal LV size and function abnormal septal motion EF 54%

3) No evidence of RV dysplasia

4) No delayed enhancement scar or infiltration of LV myocardium

5) No obvious ASD/PFO or VSD

Findings discussed with Dr Jumper

Makoto Rettig

## 2017-05-31 IMAGING — US IR INFUSION THROMBOL ARTERIAL INITIAL (MS)
1 series · 1 of 1 positions shown · non-contrast
Comparison: CT chest 09/26/2015

INDICATION: Sub massive bilateral pulmonary emboli superimposed on baseline
heart failure. Shortness of breath. Recent inguinal hernia repair
surgery.

EXAM:
1. ULTRASOUND GUIDANCE FOR VENOUS ACCESS X2
2. BILATERAL PULMONARY ARTERIOGRAPHY
3. FLUOROSCOPIC GUIDED PLACEMENT OF BILATERAL PULMONARY ARTERIAL
LYTIC INFUSION CATHETERS
TECHNIQUE: Informed written consent was obtained from the patient after a
discussion of the risks, benefits and alternatives to treatment.
Questions regarding the procedure were encouraged and answered. A
timeout was performed prior to the initiation of the procedure.

[Series 1: ir (id) (id)/(id)/(id) ir · 1 of 1 slices shown]
[im 1/1]
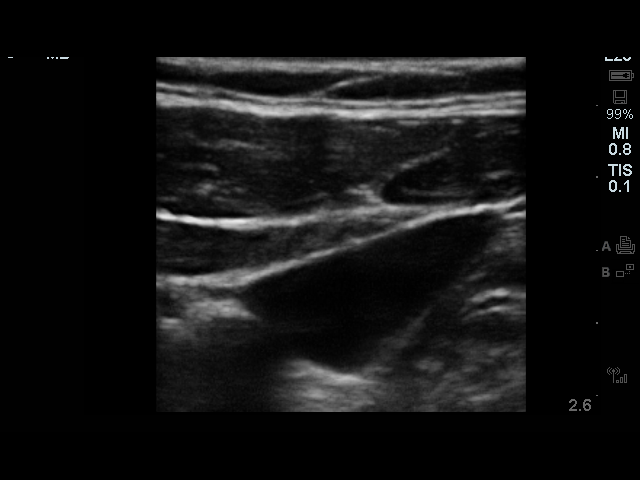

[1 of 1 positions shown; findings below may reference images not displayed]

MEDICATIONS:
Intravenous Fentanyl and Versed were administered as conscious
sedation during continuous monitoring of the patient's level of
consciousness and physiological / cardiorespiratory status by the
radiology RN, with a total moderate sedation time of 20 minutes.

CONTRAST:  None administered

FLUOROSCOPY TIME:  3.2 minutes, 287  uMym4 DAP

COMPLICATIONS:
None immediate
Ultrasound scanning demonstrated patency of the right internal
jugular vein, selected for vascular access.

The right neck was prepped and draped in the usual sterile fashion,
and a sterile drape was applied covering the operative field.
Maximum barrier sterile technique with sterile gowns and gloves were
used for the procedure. A timeout was performed prior to the
initiation of the procedure. Local anesthesia was provided with 1%
lidocaine.

Under direct ultrasound guidance, the right internal jugular vein
was accessed with a micro puncture sheath ultimately allowing
placement of a 6 French vascular sheath. Slightly cranial to this
initial access, the right internal jugular vein was again accessed
with a micropuncture sheath ultimately allowing placement of a 6
French vascular sheath.

With the use of a guidewire, an angled pigtail catheter was advanced
into the right main pulmonary artery . Pressure measurements were
then obtained from the right pulmonary artery.

Over a Benson wire, the pigtail catheter was exchanged for a 18 cm
multi side-hole EKOS ultrasound assisted infusion catheter.

With the use of a guidewire, the angled pigtail catheter was
advanced into the left main pulmonary artery . Over a Benson wire,
the pigtail catheter was exchanged for a 12 cm multi side-hole EKOS
ultrasound assisted infusion catheter.

A postprocedural fluoroscopic image was obtained of the check
demonstrating final catheter positioning.

Both vascular sheath were secured at the right neck with interrupted
0 silk suture. The external catheter tubing was secured and the
lytic therapy was initiated.

The patient tolerated the procedure well without immediate
postprocedural complication.
FINDINGS: Acquired pressure measurements:

Right  pulmonary artery - 54/19 (mean 28) (normal: < [DATE])

Following the procedure, both ultrasound assisted infusion catheter
tips terminate within the distal aspects of the bilateral lower lobe
sub segmental pulmonary arteries.
IMPRESSION: 1. Successful fluoroscopic guided initiation of bilateral ultrasound
assisted catheter directed pulmonary arterial lysis for sub massive
pulmonary embolism and right-sided heart strain.
2. Markedly elevated pressure measurements within the right main
pulmonary artery compatible with critical pulmonary arterial
hypertension.

PLAN:
- The patient will return to the interventional [HOSPITAL]
following the initiation of the catheter directed pulmonary arterial
lysis, for repeat pressure measurements and either removal of the
catheters or continuation of the catheter directed thrombolysis.

## 2018-10-25 ENCOUNTER — Telehealth (HOSPITAL_COMMUNITY): Payer: Self-pay | Admitting: *Deleted

## 2018-10-25 NOTE — Telephone Encounter (Signed)
Called patient to advise. Pt did not answer.

## 2018-10-25 NOTE — Telephone Encounter (Signed)
-----   Message from Scarlette Calico, RN sent at 10/25/2018 10:49 AM EDT ----- Regarding: RE: DB appt He doesn't have HF and we graduated him in 2018 and advised he f/u w/Dr Marlou Porch at [facility] office, he saw him in April 2018, clearance will need to come from them, please advise him to call there, he can probably just see one of the APPs there on their specific surg clearance schedule, thanks ----- Message ----- From: Harvie Junior, CMA Sent: 10/25/2018   9:25 AM EDT To: Scarlette Calico, RN Subject: DB appt                                        Pt is requesting an asap appt with DB for surgical clearance. Scheduling explained to patient that DB does not have any appointments until December. Pts last appt January 2018. Can you help me find a spot to schedule him?or see what DB wants to do....  Thanks

## 2018-11-01 ENCOUNTER — Other Ambulatory Visit: Payer: Self-pay

## 2018-11-01 ENCOUNTER — Ambulatory Visit (INDEPENDENT_AMBULATORY_CARE_PROVIDER_SITE_OTHER): Payer: 59 | Admitting: Cardiology

## 2018-11-01 ENCOUNTER — Encounter: Payer: Self-pay | Admitting: Cardiology

## 2018-11-01 VITALS — BP 120/80 | HR 67 | Ht 73.0 in | Wt 208.0 lb

## 2018-11-01 DIAGNOSIS — Z86711 Personal history of pulmonary embolism: Secondary | ICD-10-CM

## 2018-11-01 DIAGNOSIS — I447 Left bundle-branch block, unspecified: Secondary | ICD-10-CM | POA: Diagnosis not present

## 2018-11-01 DIAGNOSIS — I824Y3 Acute embolism and thrombosis of unspecified deep veins of proximal lower extremity, bilateral: Secondary | ICD-10-CM | POA: Diagnosis not present

## 2018-11-01 DIAGNOSIS — R9431 Abnormal electrocardiogram [ECG] [EKG]: Secondary | ICD-10-CM

## 2018-11-01 NOTE — Patient Instructions (Addendum)
Medication Instructions:  The current medical regimen is effective;  continue present plan and medications.  *If you need a refill on your cardiac medications before your next appointment, please call your pharmacy*  Testing/Procedures: Your physician has requested that you have an echocardiogram. Echocardiography is a painless test that uses sound waves to create images of your heart. It provides your doctor with information about the size and shape of your heart and how well your heart's chambers and valves are working. This procedure takes approximately one hour. There are no restrictions for this procedure.  You have been referred to Hematology for further evaluation of possible clotting disorder.  You will be contacted to be scheduled for this.  Follow-Up: At [facility], you and your health needs are our priority.  As part of our continuing mission to provide you with exceptional heart care, we have created designated Provider Care Teams.  These Care Teams include your primary Cardiologist (physician) and Advanced Practice Providers (APPs -  Physician Assistants and Nurse Practitioners) who all work together to provide you with the care you need, when you need it.  Your next appointment:   12 months  The format for your next appointment:   In Person  Provider:   You may see Candee Furbish, MD or one of the following Advanced Practice Providers on your designated Care Team:    Truitt Merle, NP  Cecilie Kicks, NP  Kathyrn Drown, NP   Thank you for choosing [facility]!!

## 2018-11-01 NOTE — Progress Notes (Signed)
Cardiology Office Note    Date:  11/01/2018   ID:  Wayne Bolton, DOB Oct 29, 1965, MRN [id number]  PCP:  Patient, No Pcp Per  Cardiologist:   Candee Furbish, MD     History of Present Illness:  Wayne Bolton is a 53 y.o. male here for preoperative evaluation prior to knee arthroscopy secondary to torn meniscus by Dr. Mardelle Matte.   Previously had submassive PE post op hernia repair on 09/22/15 that presented as hypotension and syncope.  When seen originally post op in PACU, he had gotten up to go the bathroom and felt dizzy and had near syncopal episode. He was hydrated and eventually, BP improved and he was discharged from PACU. At home, 2 days later, he had another syncopal episode. New LBBB and elevated troponin noted with ECHO demonstrating new EF of 30% (PASP 35). Had Left and right heart cath with showed significantly elevated right heart filling pressures, moderate PHTN. He then had cardiac MRI which showed severe RV enlargement. No CAD. No PFO or ASD. Given the RV findings, he had CTA of chest which showed submassive bilateral PE with RV strain.   Underwent EKOS, catheter directed lysis on 9/22. Bilateral DVT's were noted. He began to show improvement hour by hour.   He was then placed on Eliquis.   ECHO EF on repeat was 50% with normal PA pressures per Dr. Clayborne Dana note but report states EF 25%. Personally reviewed echocardiogram. Approximately the previous LVEF which on MRI was calculated at 54%.  Currently is not having any chest pain no shortness of breath no fevers chills nausea vomiting syncope.  No further clotting.  No CP no SOB.  Past Medical History:  Diagnosis Date  . Headache    history migraines none in last few years  . History of pulmonary embolus (PE) 04/12/2016    Past Surgical History:  Procedure Laterality Date  . INGUINAL HERNIA REPAIR Bilateral 09/22/2015   Procedure: LAPAROSCOPIC BILATERAL INGUINAL HERNIA REPAIR WITH MESH;  Surgeon: Jackolyn Confer, MD;   Location: [facility];  Service: General;  Laterality: Bilateral;  . INSERTION OF MESH Bilateral 09/22/2015   Procedure: INSERTION OF MESH;  Surgeon: Jackolyn Confer, MD;  Location: [facility];  Service: General;  Laterality: Bilateral;  . IR GENERIC HISTORICAL  09/27/2015   IR ANGIOGRAM SELECTIVE EACH ADDITIONAL VESSEL 09/27/2015 Arne Cleveland, MD [facility]  . IR GENERIC HISTORICAL  09/27/2015   IR ANGIOGRAM SELECTIVE EACH ADDITIONAL VESSEL 09/27/2015 Arne Cleveland, MD [facility]  . IR GENERIC HISTORICAL  09/27/2015   IR INFUSION THROMBOL ARTERIAL INITIAL (MS) 09/27/2015 Arne Cleveland, MD [facility]  . IR GENERIC HISTORICAL  09/27/2015   IR US GUIDE VASC ACCESS RIGHT 09/27/2015 Arne Cleveland, MD [facility]  . IR GENERIC HISTORICAL  09/27/2015   IR ANGIOGRAM PULMONARY BILATERAL SELECTIVE 09/27/2015 Arne Cleveland, MD [facility]  . IR GENERIC HISTORICAL  09/27/2015   IR INFUSION THROMBOL ARTERIAL INITIAL (MS) 09/27/2015 Arne Cleveland, MD [facility]  . IR GENERIC HISTORICAL  09/28/2015   IR THROMB F/U EVAL ART/VEN FINAL DAY (MS) 09/28/2015 Arne Cleveland, MD [facility]  . TONSILLECTOMY      Current Medications: Outpatient Medications Prior to Visit  Medication Sig Dispense Refill  . ibuprofen (ADVIL,MOTRIN) 200 MG tablet Take 400 mg by mouth every 6 (six) hours as needed for mild pain.     No facility-administered medications prior to visit.      Allergies:   No known allergies   Social History  Socioeconomic History  . Marital status: Married    Spouse name: Not on file  . Number of children: Not on file  . Years of education: Not on file  . Highest education level: Not on file  Occupational History  . Occupation: Acupuncturist  Social Needs  . Financial resource strain: Not on file  . Food insecurity    Worry: Not on file    Inability: Not on file  . Transportation needs    Medical: Not on file    Non-medical: Not on file  Tobacco Use  . Smoking status: Never  Smoker  . Smokeless tobacco: Never Used  Substance and Sexual Activity  . Alcohol use: Yes    Comment: occasionally  . Drug use: No  . Sexual activity: Not on file  Lifestyle  . Physical activity    Days per week: Not on file    Minutes per session: Not on file  . Stress: Not on file  Relationships  . Social Musician on phone: Not on file    Gets together: Not on file    Attends religious service: Not on file    Active member of club or organization: Not on file    Attends meetings of clubs or organizations: Not on file    Relationship status: Not on file  Other Topics Concern  . Not on file  Social History Narrative   Lives with wife in [place], 2 children in college.     Family History:  The patient's family history includes Other (age of onset: 81) in his paternal grandfather.   ROS:   Please see the history of present illness.    ROS All other systems reviewed and are negative.   PHYSICAL EXAM:   VS:  BP 120/80   Pulse 67   Ht 6\' 1"  (1.854 m)   Wt 208 lb (94.3 kg)   SpO2 98%   BMI 27.44 kg/m    GEN: Well nourished, well developed, in no acute distress  HEENT: normal  Neck: no JVD, carotid bruits, or masses Cardiac: RRR; no murmurs, rubs, or gallops,no edema  Respiratory:  clear to auscultation bilaterally, normal work of breathing GI: soft, nontender, nondistended, + BS MS: no deformity or atrophy  Skin: warm and dry, no rash Neuro:  Alert and Oriented x 3, Strength and sensation are intact Psych: euthymic mood, full affect  Wt Readings from Last 3 Encounters:  11/01/18 208 lb (94.3 kg)  04/12/16 206 lb 12.8 oz (93.8 kg)  01/29/16 203 lb (92.1 kg)      Studies/Labs Reviewed:   EKG:  NSR, LBBB - ECG with SR and LBBB  Recent Labs: No results found for requested labs within last 8760 hours.   Lipid Panel No results found for: CHOL, TRIG, HDL, CHOLHDL, VLDL, LDLCALC, LDLDIRECT  Additional studies/ records that were reviewed today  include:  Hospital records and Dr. 01/31/16 records reviewed.  ECHO reviewed  01/29/16 - Left ventricle: The cavity size was mildly dilated. Wall   thickness was normal. Systolic function was severely reduced. The   estimated ejection fraction was in the range of 25% to 30% with   diffuse hypokinesis; inferior akinesis. Doppler parameters are   consistent with abnormal left ventricular relaxation (grade 1   diastolic dysfunction). - Right ventricle: Systolic function was mildly reduced.  Cath 09/25/15  - Conclusions: 1. Minimal, non-obstructive coronary artery disease, with 30% stenosis at the ostium of first diagonal branch. 2.  Significantly elevated left and right heart filling pressures and moderate pulmonary hypertension. 3. Decreased Fick cardiac output/index.   ASSESSMENT:    1. LBBB (left bundle branch block)   2. EKG, abnormal   3. History of pulmonary embolus (PE)   4. Deep vein thrombosis (DVT) of proximal vein of both lower extremities, unspecified chronicity (HCC)      PLAN:  In order of problems listed above:  Preoperative risk stratification -Overall he is not having any shortness of breath no chest pain no syncope.  I do believe that he will be able to proceed with knee arthroscopic surgery with low overall cardiac risk.  However, I would like to check an echocardiogram to ensure that he is maintaining his prior EF of 54% that was seen on MRI. -Also, he does have a history of DVT post surgery, prior hernia repair.  Ended up with bilateral DVTs saddle PE, transient RV failure.  EKOS catheter with directed thrombolytics.  This occurred after multiple syncopal episodes directed him to the emergency room.  It would probably be a good idea to at least place him on aspirin following the surgery.  Early mobility. -I will also refer him over to hematology now that he is off of any anticoagulation to discuss the possibility of hypercoagulable state given the unusual  circumstance surrounding his surgery although technically, this is likely a provoked DVT/PE.   LBBB  - stable no changes  - No significant CAD on cath  - diet/exercise   Medication Adjustments/Labs and Tests Ordered: Current medicines are reviewed at length with the patient today.  Concerns regarding medicines are outlined above.  Medication changes, Labs and Tests ordered today are listed in the Patient Instructions below. Patient Instructions  Medication Instructions:  The current medical regimen is effective;  continue present plan and medications.  *If you need a refill on your cardiac medications before your next appointment, please call your pharmacy*  Testing/Procedures: Your physician has requested that you have an echocardiogram. Echocardiography is a painless test that uses sound waves to create images of your heart. It provides your doctor with information about the size and shape of your heart and how well your heart's chambers and valves are working. This procedure takes approximately one hour. There are no restrictions for this procedure.  You have been referred to Hematology for further evaluation of possible clotting disorder.  You will be contacted to be scheduled for this.  Follow-Up: At [facility], you and your health needs are our priority.  As part of our continuing mission to provide you with exceptional heart care, we have created designated Provider Care Teams.  These Care Teams include your primary Cardiologist (physician) and Advanced Practice Providers (APPs -  Physician Assistants and Nurse Practitioners) who all work together to provide you with the care you need, when you need it.  Your next appointment:   12 months  The format for your next appointment:   In Person  Provider:   You may see Donato SchultzMark Skains, MD or one of the following Advanced Practice Providers on your designated Care Team:    Norma FredricksonLori Gerhardt, NP  Nada BoozerLaura Ingold, NP  Georgie ChardJill McDaniel, NP    Thank you for choosing [facility]!!        Signed, Donato SchultzMark Skains, MD  11/01/2018 5:09 PM    [facility] [address], [place], [place]  [address] Phone: [phone]; Fax: [phone]

## 2018-11-06 ENCOUNTER — Telehealth: Payer: Self-pay | Admitting: Hematology and Oncology

## 2018-11-06 NOTE — Telephone Encounter (Signed)
Confirmed new patient appointment 11/9 with Dr. Lorenso Courier.

## 2018-11-07 ENCOUNTER — Other Ambulatory Visit: Payer: Self-pay

## 2018-11-07 ENCOUNTER — Ambulatory Visit (HOSPITAL_COMMUNITY): Payer: 59 | Attending: Cardiology

## 2018-11-07 DIAGNOSIS — R9431 Abnormal electrocardiogram [ECG] [EKG]: Secondary | ICD-10-CM

## 2018-11-07 DIAGNOSIS — I447 Left bundle-branch block, unspecified: Secondary | ICD-10-CM | POA: Diagnosis not present

## 2018-11-13 ENCOUNTER — Other Ambulatory Visit: Payer: Self-pay

## 2018-11-13 ENCOUNTER — Inpatient Hospital Stay: Payer: 59

## 2018-11-13 ENCOUNTER — Inpatient Hospital Stay: Payer: 59 | Attending: Hematology and Oncology | Admitting: Hematology and Oncology

## 2018-11-13 ENCOUNTER — Encounter: Payer: Self-pay | Admitting: Hematology and Oncology

## 2018-11-13 VITALS — BP 116/83 | HR 66 | Temp 98.2°F | Resp 17 | Ht 73.0 in | Wt 208.3 lb

## 2018-11-13 DIAGNOSIS — Z79899 Other long term (current) drug therapy: Secondary | ICD-10-CM | POA: Insufficient documentation

## 2018-11-13 DIAGNOSIS — Z86718 Personal history of other venous thrombosis and embolism: Secondary | ICD-10-CM | POA: Insufficient documentation

## 2018-11-13 DIAGNOSIS — I2602 Saddle embolus of pulmonary artery with acute cor pulmonale: Secondary | ICD-10-CM

## 2018-11-13 DIAGNOSIS — Z23 Encounter for immunization: Secondary | ICD-10-CM | POA: Diagnosis not present

## 2018-11-13 DIAGNOSIS — Z86711 Personal history of pulmonary embolism: Secondary | ICD-10-CM | POA: Diagnosis not present

## 2018-11-13 MED ORDER — INFLUENZA VAC SPLIT QUAD 0.5 ML IM SUSY
PREFILLED_SYRINGE | INTRAMUSCULAR | Status: AC
  Filled 2018-11-13: qty 0.5

## 2018-11-13 MED ORDER — INFLUENZA VAC SPLIT QUAD 0.5 ML IM SUSY
0.5000 mL | PREFILLED_SYRINGE | Freq: Once | INTRAMUSCULAR | Status: AC
  Administered 2018-11-13: 11:00:00 0.5 mL via INTRAMUSCULAR

## 2018-11-13 NOTE — Progress Notes (Signed)
[facility] [phone]   [phone]  INITIAL CONSULT NOTE  Patient Care Team: Patient, No Pcp Per as PCP - General (General Practice) Jake Bathe, MD as PCP - Cardiology (Cardiology)  Hematological/Oncological History  # Pulmonary Embolism and Bilateral LE DVTs 1) 09/22/2015: underwent bilateral hernia repair with mesh 2) 09/26/2015: CT PE study performed due to syncopal episodes post-operatively. Findings consistent with acute PE with right heart strain.  3) 09/27/2015: underwent catheter guided thrombolysis of PE 4) 09/28/2015: bilateral LE US showed acute deep vein thrombosis involving the   peroneal and soleal vien of the right lower extremity and acute deep vein thrombosis involving the gastrocnemius vein of the left lower extremity 5)  March 2018: completed 6 months of apixaban therapy 6) 11/13/2018: establish care with Dr. Leonides Schanz   CHIEF COMPLAINTS/PURPOSE OF CONSULTATION:  Recommendations regarding prophylaxis prior to knee arthroscopy in the setting of prior pulmonary embolism.   HISTORY OF PRESENTING ILLNESS:  Wayne Bolton 53 y.o. male with medical history significant for submassive PE in 2017 with right heart strain who presents for preoperative evaluation prior to a left knee arthroscopy.   On review of the outside records, [name] underwent underwent bilateral hernia repair with mesh on 09/22/2015. He notes he had a near syncopal episode in the hospital after getting up to urinate in the post op unit. He was d/c home and continued to have episodes of blacking out on exertion. He notes no chest pain or shortness of breath at that time. On 09/26/15 he underwent a CT study which confirmed a acute PE with right heart strain. He underwent thrombolysis with IR and was started on apixaban, which he continued for 6 months. He notes no trouble with bleeding/bruising on the medication. He has had no further VTE events since d/c the medication.   On exam today he notes he is doing well. He is limited by his left knee, but is otherwise active. He is currently preparing for his upcoming surgery (yet to be scheduled). He notes no bruising, bleeding, or dark stools. He denies any nausea, vomiting, or diarrhea. He reports he is feeling well overall. Of note, he is a never smoker with no family history of clotting/bleeding disorders.   A 10 point ROS is listed below.   MEDICAL HISTORY:  Past Medical History:  Diagnosis Date  . Headache    history migraines none in last few years  . History of pulmonary embolus (PE) 04/12/2016    SURGICAL HISTORY: Past Surgical History:  Procedure Laterality Date  . INGUINAL HERNIA REPAIR Bilateral 09/22/2015   Procedure: LAPAROSCOPIC BILATERAL INGUINAL HERNIA REPAIR WITH MESH;  Surgeon: Avel Peace, MD;  Location: [facility] OR;  Service: General;  Laterality: Bilateral;  . INSERTION OF MESH Bilateral 09/22/2015   Procedure: INSERTION OF MESH;  Surgeon: Avel Peace, MD;  Location: [facility] OR;  Service: General;  Laterality: Bilateral;  . IR GENERIC HISTORICAL  09/27/2015   IR ANGIOGRAM SELECTIVE EACH ADDITIONAL VESSEL 09/27/2015 Oley Balm, MD [facility]  . IR GENERIC HISTORICAL  09/27/2015   IR ANGIOGRAM SELECTIVE EACH ADDITIONAL VESSEL 09/27/2015 Oley Balm, MD [facility]  . IR GENERIC HISTORICAL  09/27/2015   IR INFUSION THROMBOL ARTERIAL INITIAL (MS) 09/27/2015 Oley Balm, MD [facility]  . IR GENERIC HISTORICAL  09/27/2015   IR US GUIDE VASC ACCESS RIGHT 09/27/2015 Oley Balm, MD [facility]  . IR GENERIC HISTORICAL  09/27/2015   IR ANGIOGRAM PULMONARY BILATERAL SELECTIVE 09/27/2015 Oley Balm, MD  [facility]  . IR GENERIC HISTORICAL  09/27/2015   IR INFUSION THROMBOL ARTERIAL INITIAL (MS) 09/27/2015 Oley Balmaniel Hassell, MD [facility]  . IR GENERIC HISTORICAL  09/28/2015   IR THROMB F/U EVAL ART/VEN FINAL DAY (MS) 09/28/2015 Oley Balmaniel Hassell, MD [facility]  . TONSILLECTOMY       SOCIAL HISTORY: Social History   Socioeconomic History  . Marital status: Married    Spouse name: Not on file  . Number of children: 2  . Years of education: Not on file  . Highest education level: Not on file  Occupational History  . Occupation: Acupuncturistlectrical Engineer  Social Needs  . Financial resource strain: Not on file  . Food insecurity    Worry: Not on file    Inability: Not on file  . Transportation needs    Medical: Not on file    Non-medical: Not on file  Tobacco Use  . Smoking status: Never Smoker  . Smokeless tobacco: Never Used  Substance and Sexual Activity  . Alcohol use: Yes    Comment: occasionally  . Drug use: No  . Sexual activity: Not on file  Lifestyle  . Physical activity    Days per week: Not on file    Minutes per session: Not on file  . Stress: Not on file  Relationships  . Social Musicianconnections    Talks on phone: Not on file    Gets together: Not on file    Attends religious service: Not on file    Active member of club or organization: Not on file    Attends meetings of clubs or organizations: Not on file    Relationship status: Not on file  . Intimate partner violence    Fear of current or ex partner: Not on file    Emotionally abused: Not on file    Physically abused: Not on file    Forced sexual activity: Not on file  Other Topics Concern  . Not on file  Social History Narrative   [place] in [place], 2 children in college.    FAMILY HISTORY: Family History  Problem Relation Age of Onset  . Hypertension Father   . Other Paternal Grandfather [id number]       felt fine, came home from dinner, found dead, no autopsy  . Heart attack Paternal Uncle     ALLERGIES:  is allergic to no known allergies.  MEDICATIONS:  Current Outpatient Medications  Medication Sig Dispense Refill  . ibuprofen (ADVIL,MOTRIN) 200 MG tablet Take 400 mg by mouth every 6 (six) hours as needed for mild pain.     No current facility-administered medications  for this visit.     REVIEW OF SYSTEMS:   Constitutional: ( - ) fevers, ( - )  chills , ( - ) night sweats Eyes: ( - ) blurriness of vision, ( - ) double vision, ( - ) watery eyes Ears, nose, mouth, throat, and face: ( - ) mucositis, ( - ) sore throat Respiratory: ( - ) cough, ( - ) dyspnea, ( - ) wheezes Cardiovascular: ( - ) palpitation, ( - ) chest discomfort, ( - ) lower extremity swelling Gastrointestinal:  ( - ) nausea, ( - ) heartburn, ( - ) change in bowel habits Skin: ( - ) abnormal skin rashes Lymphatics: ( - ) new lymphadenopathy, ( - ) easy bruising Neurological: ( - ) numbness, ( - ) tingling, ( - ) new weaknesses Behavioral/Psych: ( - ) mood change, ( - )  new changes  All other systems were reviewed with the patient and are negative.  PHYSICAL EXAMINATION: ECOG PERFORMANCE STATUS: 0 - Asymptomatic  Vitals:   11/13/18 1006  BP: 116/83  Pulse: 66  Resp: 17  Temp: 98.2 F (36.8 C)  SpO2: 100%   Filed Weights   11/13/18 1006  Weight: 208 lb 4.8 oz (94.5 kg)    GENERAL: well appearing middle aged Caucasian male in NAD  SKIN: skin color, texture, turgor are normal, no rashes or significant lesions EYES: conjunctiva are pink and non-injected, sclera clear LUNGS: clear to auscultation and percussion with normal breathing effort HEART: regular rate & rhythm and no murmurs and no lower extremity edema ABDOMEN: soft, non-tender, non-distended, normal bowel sounds Musculoskeletal: no cyanosis of digits and no clubbing  PSYCH: alert & oriented x 3, fluent speech NEURO: no focal motor/sensory deficits  LABORATORY DATA:  I have reviewed the data as listed Lab Results  Component Value Date   WBC 11.5 (H) 09/30/2015   HGB 12.9 (L) 09/30/2015   HCT 38.6 (L) 09/30/2015   MCV 91.5 09/30/2015   PLT 278 09/30/2015   NEUTROABS 7.8 (H) 09/27/2015    PATHOLOGY: None to review  RADIOGRAPHIC STUDIES: I have personally reviewed the radiological images as listed and agreed  with the findings in the report: right sided pulmonary embolism.   CLINICAL DATA:  Shortness of breath.  Four days postoperative  EXAM: CT ANGIOGRAPHY CHEST WITH CONTRAST  TECHNIQUE: Multidetector CT imaging of the chest was performed using the standard protocol during bolus administration of intravenous contrast. Multiplanar CT image reconstructions and MIPs were obtained to evaluate the vascular anatomy.  CONTRAST:  100 mL Isovue 370 nonionic  COMPARISON:  None.  FINDINGS: Cardiovascular: There is extensive pulmonary embolus bilaterally. Pulmonary emboli arise from the distal main pulmonary artery on the left extending into multiple upper and lower lobe branches. Pulmonary embolus on the right arises at the origin of the right intralobar pulmonary artery extending throughout the right lower lobe branches. There is right heart strain, evidenced by a right ventricle to left ventricle diameter ratio of 1.2, normal less than 0.9.  There is prominence of the ascending thoracic aorta with a maximum transverse diameter of 4.0 x 3.9 cm. There is no thoracic aortic dissection. The visualized great vessels appear unremarkable. Pericardium is not appreciably thickened.  Mediastinum/Nodes: Thyroid appears normal. There is no appreciable thoracic adenopathy.  Lungs/Pleura: There is mild scarring in the lung apices. There is no parenchymal lung edema or consolidation. There is mild bibasilar atelectasis, however.  Upper Abdomen: There is hepatic steatosis. Visualized upper abdominal structures otherwise appear unremarkable.  Musculoskeletal: There are no blastic or lytic bone lesions.  Review of the MIP images confirms the above findings.  IMPRESSION: Positive for acute PE with CT evidence of right heart strain (RV/LV Ratio = 1.2) consistent with at least submassive (intermediate risk) PE. The presence of right heart strain has been associated with an increased risk  of morbidity and mortality. Please activate Code PE by paging [phone].  Prominence of the ascending thoracic aorta with a measured transverse diameter 4.0 x 3.9 cm. Recommend annual imaging followup by CTA or MRA. This recommendation follows 2010 ACCF/AHA/AATS/ACR/ASA/SCA/SCAI/SIR/STS/SVM Guidelines for the Diagnosis and Management of Patients with Thoracic Aortic Disease. Circulation. 2010; 121: U981-X914  Mild bibasilar atelectasis. No edema or consolidation. No evident adenopathy. There is hepatic steatosis.  Critical Value/emergent results were called by telephone at the time of interpretation on 09/26/2015 at 4:38 pm  to Dr. Rito Ehrlich, covering hospitalist, who verbally acknowledged these results.   Electronically Signed   By: Bretta Bang III M.D.   On: 09/26/2015 16:39  ASSESSMENT & PLAN Wayne Bolton 53 y.o. male with medical history significant for submassive PE in 2017 with right heart strain who presents for preoperative evaluation prior to a left knee arthroscopy. Mr. Leicht is considered at increased risk of a post procedural VTE given his prior history of PE. Routine administration of VTE prophylaxis for knee arthroscopy is not recommended, however in the setting of prior VTE we should consider preventative therapy. Literature is scarce and mostly based on expert opinion. After reviewing the NICE guidelines it would be reasonable to consider 2 weeks of prophylactic dose LMWH, assuming he is fully ambulatory and active by the end of that period of time.   Further hypercoagulation workup is not merited at this time, given that his prior clot was provoked in the setting of surgery and he has had no other episodes of VTE. We recommend that he follow up with our clinic prior to d/c his LMWH (approximately 2 weeks after surgery). After he has completed anticoagulation therapy he will no longer require f/u with our clinic unless he were to develop another VTE or  hemotalogical concern.   #Personal History of Pulmonary Embolism with Right Heart Strain --history of provoked clot following bilateral hernia surgery in 2017. No other personal history of blood clots --no bleeding risks and previously tolerated apixaban without difficulty.  --prior genetic testing included negative Factor V Leiden and Prothrombin gene mutation. In the setting of a provoked clot, there is no need for further hypercoagulation workup -- given his prior history of post operative VTE we would recommend VTE prophylaxis following his surgery. He should be given at least 14 days of LMWH at prophylaxis dose starting after the surgery (recommendations per the NICE guidelines) --in the unlikely event he has prolonged immobilization of the leg or is not ambulating regularly, the VTE prophylaxis can continue up to 6 weeks total post operatively. If fully ambulatory then lovenox can be d/c after 2 weeks.  --please have the surgical team order a peri-operative CBC and BMP prior to starting lovenox.  --RTC to be scheduled approximately 1-2 weeks post operatively (anticipate late Dec 2020)  All questions were answered. The patient knows to call the clinic with any problems, questions or concerns.  A total of more than 60 minutes were spent face-to-face with the patient during this encounter and over half of that time was spent on counseling and coordination of care as outlined above.   Ulysees Barns, MD Department of Hematology/Oncology [facility] Phone: [phone] Pager: [phone] Email: [email or web address]   11/13/2018 5:21 PM    Literature Support:  [employer]. Venous thromboembolism in over 16s: reducing the risk of hospital-acquired deep vein thrombosis or pulmonary embolism. https://neal-mccoy.info/.  --Consider LMWH[4] 6-12 hours after surgery for 14 days for people  undergoing arthroscopic knee surgery if: 1) total anaesthesia time is more than 90 minutes or 2) the person's risk of VTE outweighs their risk of bleeding.  Fannie Knee, Sousa Kyla Balzarine, et al. Incidence and risk factor analysis of symptomatic venous thromboembolism after knee arthroscopy. Arthroscopy 2015;31:2112-2118.  --Patients with a history of VTEs, a history of malignancy, or 2 or more classic risk factors are at increased risk of VTEs after knee arthroscopy, and chemoprophylaxis should be considered in these select  patients.

## 2018-11-14 ENCOUNTER — Telehealth: Payer: Self-pay | Admitting: Hematology and Oncology

## 2018-11-14 NOTE — Telephone Encounter (Signed)
Scheduled per los. Called and left msg. Mailed printout  °

## 2018-12-20 ENCOUNTER — Telehealth: Payer: Self-pay | Admitting: Hematology and Oncology

## 2018-12-20 NOTE — Telephone Encounter (Signed)
Returned patient' phone call regarding cancelling December appointment, per patient's request appointment has been cancelled.

## 2018-12-22 ENCOUNTER — Ambulatory Visit: Payer: 59 | Admitting: Hematology and Oncology

## 2019-03-22 ENCOUNTER — Ambulatory Visit: Payer: 59 | Attending: Internal Medicine

## 2019-03-22 DIAGNOSIS — Z23 Encounter for immunization: Secondary | ICD-10-CM

## 2019-03-22 NOTE — Progress Notes (Signed)
   Covid-19 Vaccination Clinic  Name:  Wayne Bolton    MRN: [id number] DOB: February 05, 1965  03/22/2019  Wayne Bolton was observed post Covid-19 immunization for 15 minutes without incident. He was provided with Vaccine Information Sheet and instruction to access the V-Safe system.   Wayne Bolton was instructed to call 911 with any severe reactions post vaccine: Marland Kitchen Difficulty breathing  . Swelling of face and throat  . A fast heartbeat  . A bad rash all over body  . Dizziness and weakness   Immunizations Administered    Name Date Dose VIS Date Route   Pfizer COVID-19 Vaccine 03/22/2019  3:54 PM 0.3 mL 12/15/2018 Intramuscular   Manufacturer: [employer], [employer]   Lot: [id number]   NDC: [address]-9

## 2019-04-16 ENCOUNTER — Ambulatory Visit: Payer: 59 | Attending: Internal Medicine

## 2019-04-16 DIAGNOSIS — Z23 Encounter for immunization: Secondary | ICD-10-CM

## 2019-04-16 NOTE — Progress Notes (Signed)
   Covid-19 Vaccination Clinic  Name:  Wayne Bolton    MRN: [id number] DOB: 06/26/1965  04/16/2019  [name] was observed post Covid-19 immunization for 15 minutes without incident. He was provided with Vaccine Information Sheet and instruction to access the V-Safe system.   [name] was instructed to call 911 with any severe reactions post vaccine: Marland Kitchen Difficulty breathing  . Swelling of face and throat  . A fast heartbeat  . A bad rash all over body  . Dizziness and weakness   Immunizations Administered    Name Date Dose VIS Date Route   Pfizer COVID-19 Vaccine 04/16/2019  1:17 PM 0.3 mL 12/15/2018 Intramuscular   Manufacturer: [employer], [employer]   Lot: [id number]   NDC: [address]-5
